# Patient Record
Sex: Female | Born: 1970
Health system: Southern US, Community
[De-identification: ages and names within clinical notes are randomized; demographics above are authoritative.]

## PROBLEM LIST (undated history)

## (undated) DIAGNOSIS — F411 Generalized anxiety disorder: Secondary | ICD-10-CM

## (undated) DIAGNOSIS — I10 Essential (primary) hypertension: Secondary | ICD-10-CM

## (undated) DIAGNOSIS — R0789 Other chest pain: Secondary | ICD-10-CM

## (undated) DIAGNOSIS — R739 Hyperglycemia, unspecified: Secondary | ICD-10-CM

## (undated) DIAGNOSIS — K219 Gastro-esophageal reflux disease without esophagitis: Secondary | ICD-10-CM

## (undated) HISTORY — PX: LAPAROSCOPIC CHOLECYSTECTOMY: SUR755

---

## 2001-03-01 ENCOUNTER — Encounter: Payer: Self-pay | Admitting: Family Medicine

## 2001-03-01 ENCOUNTER — Encounter: Admission: RE | Admit: 2001-03-01 | Discharge: 2001-03-01 | Payer: Self-pay | Admitting: Family Medicine

## 2005-12-14 ENCOUNTER — Ambulatory Visit (HOSPITAL_COMMUNITY): Admission: RE | Admit: 2005-12-14 | Discharge: 2005-12-15 | Payer: Self-pay | Admitting: General Surgery

## 2010-07-02 ENCOUNTER — Other Ambulatory Visit: Payer: Self-pay | Admitting: Family Medicine

## 2010-07-02 DIAGNOSIS — N6322 Unspecified lump in the left breast, upper inner quadrant: Secondary | ICD-10-CM

## 2010-07-07 ENCOUNTER — Ambulatory Visit
Admission: RE | Admit: 2010-07-07 | Discharge: 2010-07-07 | Disposition: A | Discharge status: Home / Self Care | Payer: PRIVATE HEALTH INSURANCE | Source: Ambulatory Visit | Attending: Family Medicine | Admitting: Family Medicine

## 2010-07-07 DIAGNOSIS — N6322 Unspecified lump in the left breast, upper inner quadrant: Secondary | ICD-10-CM

## 2012-01-06 ENCOUNTER — Emergency Department: Payer: Self-pay | Admitting: Unknown Physician Specialty

## 2012-01-06 LAB — URINALYSIS, COMPLETE
Bacteria: NONE SEEN
Bilirubin,UR: NEGATIVE
Blood: NEGATIVE
Glucose,UR: NEGATIVE mg/dL (ref 0–75)
Ketone: NEGATIVE
Leukocyte Esterase: NEGATIVE
Nitrite: NEGATIVE
Ph: 5 (ref 4.5–8.0)
Protein: NEGATIVE
RBC,UR: NONE SEEN /HPF (ref 0–5)
Specific Gravity: 1.004 (ref 1.003–1.030)
Squamous Epithelial: 2
WBC UR: <1 /HPF (ref 0–5)

## 2012-01-06 LAB — CBC
HCT: 38.1 % (ref 35.0–47.0)
HGB: 12.7 g/dL (ref 12.0–16.0)
MCH: 28.7 pg (ref 26.0–34.0)
MCHC: 33.4 g/dL (ref 32.0–36.0)
MCV: 86 fL (ref 80–100)
Platelet: 274 10*3/uL (ref 150–440)
RBC: 4.43 10*6/uL (ref 3.80–5.20)
RDW: 13.5 % (ref 11.5–14.5)
WBC: 11.6 10*3/uL — ABNORMAL HIGH (ref 3.6–11.0)

## 2012-01-06 LAB — COMPREHENSIVE METABOLIC PANEL
Albumin: 4 g/dL (ref 3.4–5.0)
Alkaline Phosphatase: 78 U/L (ref 50–136)
Anion Gap: 7 (ref 7–16)
BUN: 8 mg/dL (ref 7–18)
Bilirubin,Total: 0.5 mg/dL (ref 0.2–1.0)
Calcium, Total: 9 mg/dL (ref 8.5–10.1)
Chloride: 107 mmol/L (ref 98–107)
Co2: 25 mmol/L (ref 21–32)
Creatinine: 0.66 mg/dL (ref 0.60–1.30)
EGFR (African American): >60
EGFR (Non-African Amer.): >60
Glucose: 91 mg/dL (ref 65–99)
Osmolality: 275 (ref 275–301)
Potassium: 3.5 mmol/L (ref 3.5–5.1)
SGOT(AST): 23 U/L (ref 15–37)
SGPT (ALT): 23 U/L (ref 12–78)
Sodium: 139 mmol/L (ref 136–145)
Total Protein: 8.2 g/dL (ref 6.4–8.2)

## 2012-01-08 ENCOUNTER — Ambulatory Visit: Payer: Self-pay | Admitting: Urology

## 2012-01-08 LAB — URINE CULTURE

## 2014-11-30 ENCOUNTER — Telehealth: Payer: PRIVATE HEALTH INSURANCE | Admitting: Nurse Practitioner

## 2014-11-30 DIAGNOSIS — J0100 Acute maxillary sinusitis, unspecified: Secondary | ICD-10-CM

## 2014-11-30 MED ORDER — AMOXICILLIN-POT CLAVULANATE 875-125 MG PO TABS: 1.0000 | ORAL_TABLET | Freq: Two times a day (BID) | ORAL | Status: DC

## 2014-11-30 NOTE — Progress Notes (Signed)

## 2015-02-13 ENCOUNTER — Emergency Department (HOSPITAL_COMMUNITY): Payer: 59

## 2015-02-13 ENCOUNTER — Inpatient Hospital Stay (HOSPITAL_COMMUNITY)
Admission: EM | Admit: 2015-02-13 | Discharge: 2015-02-14 | DRG: 313 | Disposition: A | Discharge status: Home / Self Care | Payer: 59 | Attending: Internal Medicine | Admitting: Internal Medicine

## 2015-02-13 ENCOUNTER — Emergency Department (HOSPITAL_BASED_OUTPATIENT_CLINIC_OR_DEPARTMENT_OTHER): Payer: 59

## 2015-02-13 ENCOUNTER — Encounter (HOSPITAL_COMMUNITY): Payer: Self-pay | Admitting: *Deleted

## 2015-02-13 DIAGNOSIS — R000 Tachycardia, unspecified: Secondary | ICD-10-CM | POA: Diagnosis present

## 2015-02-13 DIAGNOSIS — Z9114 Patient's other noncompliance with medication regimen: Secondary | ICD-10-CM | POA: Diagnosis present

## 2015-02-13 DIAGNOSIS — R0789 Other chest pain: Secondary | ICD-10-CM | POA: Diagnosis not present

## 2015-02-13 DIAGNOSIS — R739 Hyperglycemia, unspecified: Secondary | ICD-10-CM | POA: Diagnosis present

## 2015-02-13 DIAGNOSIS — Z91040 Latex allergy status: Secondary | ICD-10-CM

## 2015-02-13 DIAGNOSIS — F411 Generalized anxiety disorder: Secondary | ICD-10-CM | POA: Diagnosis present

## 2015-02-13 DIAGNOSIS — I10 Essential (primary) hypertension: Secondary | ICD-10-CM | POA: Diagnosis present

## 2015-02-13 DIAGNOSIS — R0600 Dyspnea, unspecified: Secondary | ICD-10-CM | POA: Diagnosis not present

## 2015-02-13 DIAGNOSIS — K219 Gastro-esophageal reflux disease without esophagitis: Secondary | ICD-10-CM | POA: Diagnosis present

## 2015-02-13 DIAGNOSIS — E876 Hypokalemia: Secondary | ICD-10-CM | POA: Diagnosis not present

## 2015-02-13 DIAGNOSIS — Z6833 Body mass index (BMI) 33.0-33.9, adult: Secondary | ICD-10-CM

## 2015-02-13 DIAGNOSIS — E669 Obesity, unspecified: Secondary | ICD-10-CM | POA: Diagnosis present

## 2015-02-13 DIAGNOSIS — R079 Chest pain, unspecified: Secondary | ICD-10-CM

## 2015-02-13 DIAGNOSIS — Z881 Allergy status to other antibiotic agents status: Secondary | ICD-10-CM

## 2015-02-13 HISTORY — DX: Other chest pain: R07.89

## 2015-02-13 HISTORY — DX: Generalized anxiety disorder: F41.1

## 2015-02-13 HISTORY — DX: Gastro-esophageal reflux disease without esophagitis: K21.9

## 2015-02-13 HISTORY — DX: Essential (primary) hypertension: I10

## 2015-02-13 HISTORY — DX: Hyperglycemia, unspecified: R73.9

## 2015-02-13 LAB — BASIC METABOLIC PANEL
Anion gap: 8 (ref 5–15)
Anion gap: 8 (ref 5–15)
BUN: 10 mg/dL (ref 6–20)
BUN: 8 mg/dL (ref 6–20)
CO2: 24 mmol/L (ref 22–32)
CO2: 24 mmol/L (ref 22–32)
Calcium: 8.8 mg/dL — ABNORMAL LOW (ref 8.9–10.3)
Calcium: 8.5 mg/dL — ABNORMAL LOW (ref 8.9–10.3)
Chloride: 106 mmol/L (ref 101–111)
Chloride: 107 mmol/L (ref 101–111)
Creatinine, Ser: 0.69 mg/dL (ref 0.44–1.00)
Creatinine, Ser: 0.57 mg/dL (ref 0.44–1.00)
GFR calc Af Amer: >60 mL/min (ref >60)
GFR calc Af Amer: >60 mL/min (ref >60)
GFR calc non Af Amer: >60 mL/min (ref >60)
GFR calc non Af Amer: >60 mL/min (ref >60)
Glucose, Bld: 142 mg/dL — ABNORMAL HIGH (ref 65–99)
Glucose, Bld: 107 mg/dL — ABNORMAL HIGH (ref 65–99)
Potassium: 2.6 mmol/L — CL (ref 3.5–5.1)
Potassium: 3.5 mmol/L (ref 3.5–5.1)
Sodium: 138 mmol/L (ref 135–145)
Sodium: 139 mmol/L (ref 135–145)

## 2015-02-13 LAB — CBC WITH DIFFERENTIAL/PLATELET
Basophils Absolute: 0.1 10*3/uL (ref 0.0–0.1)
Basophils Relative: 1 %
Eosinophils Absolute: 0.1 10*3/uL (ref 0.0–0.7)
Eosinophils Relative: 1 %
HCT: 37.5 % (ref 36.0–46.0)
Hemoglobin: 12.4 g/dL (ref 12.0–15.0)
Lymphocytes Relative: 26 %
Lymphs Abs: 2.3 10*3/uL (ref 0.7–4.0)
MCH: 29 pg (ref 26.0–34.0)
MCHC: 33.1 g/dL (ref 30.0–36.0)
MCV: 87.8 fL (ref 78.0–100.0)
Monocytes Absolute: 0.6 10*3/uL (ref 0.1–1.0)
Monocytes Relative: 7 %
Neutro Abs: 5.6 10*3/uL (ref 1.7–7.7)
Neutrophils Relative %: 65 %
Platelets: 271 10*3/uL (ref 150–400)
RBC: 4.27 MIL/uL (ref 3.87–5.11)
RDW: 12.8 % (ref 11.5–15.5)
WBC: 8.6 10*3/uL (ref 4.0–10.5)

## 2015-02-13 LAB — TROPONIN I
Troponin I: <0.03 ng/mL (ref <0.031)
Troponin I: <0.03 ng/mL (ref <0.031)
Troponin I: <0.03 ng/mL (ref <0.031)
Troponin I: <0.03 ng/mL (ref <0.031)

## 2015-02-13 LAB — TSH: TSH: 1.975 u[IU]/mL (ref 0.350–4.500)

## 2015-02-13 LAB — CBC
HCT: 36 % (ref 36.0–46.0)
Hemoglobin: 11.5 g/dL — ABNORMAL LOW (ref 12.0–15.0)
MCH: 28.3 pg (ref 26.0–34.0)
MCHC: 31.9 g/dL (ref 30.0–36.0)
MCV: 88.5 fL (ref 78.0–100.0)
Platelets: 270 10*3/uL (ref 150–400)
RBC: 4.07 MIL/uL (ref 3.87–5.11)
RDW: 12.8 % (ref 11.5–15.5)
WBC: 9.4 10*3/uL (ref 4.0–10.5)

## 2015-02-13 LAB — MAGNESIUM: Magnesium: 2 mg/dL (ref 1.7–2.4)

## 2015-02-13 LAB — D-DIMER, QUANTITATIVE: D-Dimer, Quant: <0.27 ug/mL-FEU (ref 0.00–0.48)

## 2015-02-13 MED ORDER — MORPHINE SULFATE (PF) 2 MG/ML IV SOLN: 2.0000 mg | INTRAVENOUS | Status: DC | PRN

## 2015-02-13 MED ORDER — POTASSIUM CHLORIDE 10 MEQ/100ML IV SOLN
INTRAVENOUS | Status: AC
  Filled 2015-02-13: qty 100

## 2015-02-13 MED ORDER — ONDANSETRON HCL 4 MG/2ML IJ SOLN: 4.0000 mg | Freq: Four times a day (QID) | INTRAMUSCULAR | Status: DC | PRN

## 2015-02-13 MED ORDER — METOPROLOL TARTRATE 25 MG PO TABS
25.0000 mg | ORAL_TABLET | Freq: Two times a day (BID) | ORAL | Status: DC
  Administered 2015-02-13 – 2015-02-14 (×3): 25 mg via ORAL
  Filled 2015-02-13 (×3): qty 1

## 2015-02-13 MED ORDER — ASPIRIN EC 81 MG PO TBEC
81.0000 mg | DELAYED_RELEASE_TABLET | Freq: Every day | ORAL | Status: DC
  Administered 2015-02-13 – 2015-02-14 (×2): 81 mg via ORAL
  Filled 2015-02-13 (×2): qty 1

## 2015-02-13 MED ORDER — MAGNESIUM SULFATE IN D5W 10-5 MG/ML-% IV SOLN
INTRAVENOUS | Status: AC
  Filled 2015-02-13: qty 100

## 2015-02-13 MED ORDER — POTASSIUM CHLORIDE 10 MEQ/100ML IV SOLN
10.0000 meq | Freq: Once | INTRAVENOUS | Status: AC
  Administered 2015-02-13: 10 meq via INTRAVENOUS

## 2015-02-13 MED ORDER — POTASSIUM CHLORIDE CRYS ER 20 MEQ PO TBCR
40.0000 meq | EXTENDED_RELEASE_TABLET | Freq: Once | ORAL | Status: AC
  Administered 2015-02-13: 40 meq via ORAL

## 2015-02-13 MED ORDER — CITALOPRAM HYDROBROMIDE 20 MG PO TABS
20.0000 mg | ORAL_TABLET | Freq: Every day | ORAL | Status: DC
  Administered 2015-02-13 – 2015-02-14 (×2): 20 mg via ORAL
  Filled 2015-02-13 (×4): qty 1

## 2015-02-13 MED ORDER — INFLUENZA VAC SPLIT QUAD 0.5 ML IM SUSY
0.5000 mL | PREFILLED_SYRINGE | INTRAMUSCULAR | Status: AC
  Administered 2015-02-14: 0.5 mL via INTRAMUSCULAR
  Filled 2015-02-13: qty 0.5

## 2015-02-13 MED ORDER — NITROGLYCERIN 2 % TD OINT
1.0000 [in_us] | TOPICAL_OINTMENT | Freq: Once | TRANSDERMAL | Status: AC
  Administered 2015-02-13: 1 [in_us] via TOPICAL
  Filled 2015-02-13: qty 1

## 2015-02-13 MED ORDER — MAGNESIUM SULFATE IN D5W 10-5 MG/ML-% IV SOLN
1.0000 g | Freq: Once | INTRAVENOUS | Status: AC
  Administered 2015-02-13: 1 g via INTRAVENOUS
  Filled 2015-02-13: qty 100

## 2015-02-13 MED ORDER — ACETAMINOPHEN 500 MG PO TABS
1000.0000 mg | ORAL_TABLET | Freq: Once | ORAL | Status: AC
  Administered 2015-02-13: 1000 mg via ORAL
  Filled 2015-02-13: qty 2

## 2015-02-13 MED ORDER — ONDANSETRON HCL 4 MG PO TABS
4.0000 mg | ORAL_TABLET | Freq: Four times a day (QID) | ORAL | Status: DC | PRN
Start: 2015-02-13 — End: 2015-02-14

## 2015-02-13 MED ORDER — POTASSIUM CHLORIDE 10 MEQ/100ML IV SOLN
10.0000 meq | INTRAVENOUS | Status: AC
  Administered 2015-02-13 (×3): 10 meq via INTRAVENOUS
  Filled 2015-02-13 (×3): qty 100

## 2015-02-13 MED ORDER — ASPIRIN 325 MG PO TABS
325.0000 mg | ORAL_TABLET | Freq: Once | ORAL | Status: AC
  Administered 2015-02-13: 325 mg via ORAL
  Filled 2015-02-13: qty 1

## 2015-02-13 MED ORDER — POTASSIUM CHLORIDE CRYS ER 20 MEQ PO TBCR
EXTENDED_RELEASE_TABLET | ORAL | Status: AC
  Filled 2015-02-13: qty 2

## 2015-02-13 MED ORDER — PANTOPRAZOLE SODIUM 40 MG PO TBEC
40.0000 mg | DELAYED_RELEASE_TABLET | Freq: Every day | ORAL | Status: DC
  Administered 2015-02-13 – 2015-02-14 (×2): 40 mg via ORAL
  Filled 2015-02-13 (×2): qty 1

## 2015-02-13 MED ORDER — HEPARIN SODIUM (PORCINE) 5000 UNIT/ML IJ SOLN
5000.0000 [IU] | Freq: Three times a day (TID) | INTRAMUSCULAR | Status: DC
  Administered 2015-02-13 – 2015-02-14 (×4): 5000 [IU] via SUBCUTANEOUS
  Filled 2015-02-13 (×4): qty 1

## 2015-02-13 MED ORDER — ALUM & MAG HYDROXIDE-SIMETH 200-200-20 MG/5ML PO SUSP: 30.0000 mL | Freq: Four times a day (QID) | ORAL | Status: DC | PRN

## 2015-02-13 MED ORDER — SODIUM CHLORIDE 0.9 % IJ SOLN
3.0000 mL | Freq: Two times a day (BID) | INTRAMUSCULAR | Status: DC
  Administered 2015-02-13 (×2): 3 mL via INTRAVENOUS

## 2015-02-13 NOTE — ED Notes (Signed)
Pt sinus tach on monitor,  

## 2015-02-13 NOTE — ED Notes (Signed)
Pt ambulatory to restroom, no complaints noted,

## 2015-02-13 NOTE — ED Notes (Signed)
Pt states that she has been having problems with elevated blood pressure and anxiety this evening while at work,

## 2015-02-13 NOTE — Progress Notes (Signed)
Patient seen and examined   Tachycardic during my exam , fairly atypical presentation, likely anxiety  Patient started on metoprolol,   Potassium repleted . D-dimer negative   Will do ECHO,check lipid panel, A1C,  If workup negative,dc home in am

## 2015-02-13 NOTE — ED Notes (Signed)
Pt c/o elevated blood pressure, left chest pain described as a dull sensation with nausea, sob that started tonight,

## 2015-02-13 NOTE — ED Notes (Signed)
Pt placed in hospital bed for comfort, no distress noted,

## 2015-02-13 NOTE — ED Notes (Signed)
Breakfast tray given to pt 

## 2015-02-13 NOTE — ED Notes (Signed)
Pt still remains pain free, Dr Nedra Hai at bedside,

## 2015-02-13 NOTE — ED Notes (Signed)
CRITICAL VALUE ALERT  Critical value received:  Potassium 2.6  Date of notification:  02/13/2015  Time of notification:  02:27  Critical value read back: yes  Nurse who received alert:  Youlanda Mighty Rn   MD notified (1st page):  Dr Wilkie Aye  Time of first page:  02:27  MD notified (2nd page):  Time of second page:  Responding MD:  Dr Ludwig Lean  Time MD responded:  02:57

## 2015-02-13 NOTE — ED Notes (Signed)
Pt ambulatory to restroom, family remains at bedside,

## 2015-02-13 NOTE — ED Provider Notes (Signed)
CSN: [id number]     Arrival date & time 02/13/15  0128 History   First MD Initiated Contact with Patient 02/13/15 0136     Chief Complaint  Patient presents with  . Chest Pain     (Consider location/radiation/quality/duration/timing/severity/associated sxs/prior Treatment) HPI  This is a 44 year old female with a history of hypertension, noncompliant with medications, who presents with chest pain. Patient reports several hours of persistent and worsening chest pain. She states that the pain is dull and in the center of chest. It is nonradiating. There is not a exertional component. She reports associated nausea. Denies shortness of breath. Denies any recent fevers or cough. Reports that she took her blood pressure at home and at work and it was elevated. She also reports that she has felt very anxious. Denies early family history of heart disease, hyperlipidemia, or diabetes. Denies any recent travel, hospitalization, or history of blood clots. Current pain is 4 out of 10. She has not taken an aspirin today.  Past Medical History  Diagnosis Date  . Hypertension    Past Surgical History  Procedure Laterality Date  . Laparoscopic cholecystectomy     History reviewed. No pertinent family history. Social History  Substance Use Topics  . Smoking status: Never Smoker   . Smokeless tobacco: None  . Alcohol Use: No   OB History    No data available     Review of Systems  Constitutional: Negative for fever.  Respiratory: Positive for chest tightness. Negative for cough and shortness of breath.   Cardiovascular: Positive for chest pain. Negative for leg swelling.  Gastrointestinal: Positive for nausea. Negative for vomiting and abdominal pain.  Genitourinary: Negative for dysuria.  Skin: Negative for wound.  Neurological: Negative for headaches.  All other systems reviewed and are negative.     Allergies  Latex and Lisinopril  Home Medications   Prior to Admission medications    Medication Sig Start Date End Date Taking? Authorizing Andrei Mccook  amoxicillin-clavulanate (AUGMENTIN) 875-125 MG per tablet Take 1 tablet by mouth 2 (two) times daily. 11/30/14   Mary-Margaret Daphine Deutscher, FNP   BP 151/86 mmHg  Pulse 97  Temp(Src) 97.8 F (36.6 C) (Oral)  Resp 21  Ht  (1.702 m)  Wt 211 lb (95.709 kg)  BMI 33.04 kg/m2  SpO2 97%  LMP 02/03/2015 Physical Exam  Constitutional: She is oriented to person, place, and time. She appears well-developed and well-nourished.  Overweight  HENT:  Head: Normocephalic and atraumatic.  Cardiovascular: Normal rate, regular rhythm and normal heart sounds.   No murmur heard. Pulmonary/Chest: Effort normal and breath sounds normal. No respiratory distress. She has no wheezes.  Abdominal: Soft. Bowel sounds are normal. There is no tenderness. There is no rebound and no guarding.  Musculoskeletal: She exhibits no edema.  Neurological: She is alert and oriented to person, place, and time.  Skin: Skin is warm and dry.  Psychiatric: She has a normal mood and affect.  Nursing note and vitals reviewed.   ED Course  Procedures (including critical care time) Labs Review Labs Reviewed  BASIC METABOLIC PANEL - Abnormal; Notable for the following:    Potassium 2.6 (*)    Glucose, Bld 142 (*)    Calcium 8.8 (*)    All other components within normal limits  CBC WITH DIFFERENTIAL/PLATELET  TROPONIN I  D-DIMER, QUANTITATIVE (NOT AT [facility])    Imaging Review No results found. I have personally reviewed and evaluated these images and lab results as part of  my medical decision-making.   EKG Interpretation   Date/Time:  Wednesday February 13 2015 01:40:12 EDT Ventricular Rate:  111 PR Interval:  168 QRS Duration: 93 QT Interval:  345 QTC Calculation: 469 R Axis:   1 Text Interpretation:  Sinus tachycardia Ventricular premature complex  Confirmed by HORTON  MD, COURTNEY ([id number]) on 02/13/2015 1:46:19 AM      MDM   Final diagnoses:   Chest pain, unspecified chest pain type  Essential hypertension  Hypokalemia    Patient presents with chest pain. Ongoing for the last several hours. Nontoxic. Vital signs notable for blood pressure of 196/110. She is noncompliant with blood pressure medications. Risk factors for heart disease include hypertension.  Denies other risk factors and ;however glucose is 142 on BMP and patient is obese. Given blood pressure and chest pain, nitroglycerin paste was applied. Aspirin was given. Patient was also tachycardic. For this reason d-dimer was sent. This is negative. EKG is nonischemic. Lab work including initial troponin and d-dimer is negative. Patient was noted to have a potassium of 2.6. Both oral and IV potassium was ordered in addition to magnesium.  2:53 AM On recheck, patient reports improvement of chest pain after administration of nitroglycerin. Currently reports chest pain is 0 out of 10.  Feel patient would benefit from observation for potassium replacement and blood pressure modification as well as serial enzymes. Improvement in chest pain could be secondary to blood pressure improvement versus ACS.  Patient's heart score is 4 if you include obesity and diabetes given evidence of hyperglycemia on testing.    Shon Baton, MD 02/13/15 [phone]

## 2015-02-13 NOTE — ED Notes (Signed)
Pt and family updated on plan of care, no complaints noted,

## 2015-02-13 NOTE — ED Notes (Signed)
Pt reports that she has not been taking her blood pressure medication

## 2015-02-13 NOTE — H&P (Signed)
[facility] History and Physical  Sharel C Dario ZOX:[id number] DOB: 1970/08/02    PCP:   None.   Chief Complaint: chest pain.   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 She has no SOB,nausea, vomiting or abdominal pain.  She has no black or bloody stool.  She was found to have severe HTN in the ER with BP 167/87.  She was given NTG with improvement of her BP and her chest tightness.  Evaluation included EKG with no acute ST T changes, K of 2.6, and Cr of 0.7.  Her CXR was clear.  Hospitalist was asked to admit her for HTN, atypcial chest pain and hypokalemia.   Rewiew of Systems:  Constitutional: Negative for malaise, fever and chills. No significant weight loss or weight gain Eyes: Negative for eye pain, redness and discharge, diplopia, visual changes, or flashes of light. ENMT: Negative for ear pain, hoarseness, nasal congestion, sinus pressure and sore throat. No headaches; tinnitus, drooling, or problem swallowing. Cardiovascular: Negative for  palpitations, diaphoresis, dyspnea and peripheral edema. ; No orthopnea, PND Respiratory: Negative for cough, hemoptysis, wheezing and stridor. No pleuritic chestpain. Gastrointestinal: Negative for nausea, vomiting, diarrhea, constipation, abdominal pain, melena, blood in stool, hematemesis, jaundice and rectal bleeding.    Genitourinary: Negative for frequency, dysuria, incontinence,flank pain and hematuria; Musculoskeletal: Negative for back pain and neck pain. Negative for swelling and trauma.;  Skin: . Negative for pruritus, rash, abrasions, bruising and skin lesion.; ulcerations Neuro: Negative for headache, lightheadedness and neck stiffness. Negative for weakness, altered level of consciousness , altered mental status, extremity weakness, burning feet,  involuntary movement, seizure and syncope.  Psych: negative for anxiety, depression, insomnia, tearfulness, panic attacks, hallucinations, paranoia, suicidal or homicidal ideation    Past Medical History  Diagnosis Date  . Hypertension     Past Surgical History  Procedure Laterality Date  . Laparoscopic cholecystectomy      Medications:  HOME MEDS: Prior to Admission medications   Medication Sig Start Date End Date Taking? Authorizing Kella Splinter  amoxicillin-clavulanate (AUGMENTIN) 875-125 MG per tablet Take 1 tablet by mouth 2 (two) times daily. 11/30/14   Mary-Margaret Daphine Deutscher, FNP     Allergies:  Allergies  Allergen Reactions  . Latex   . Lisinopril     Social History:   reports that she has never smoked. She does not have any smokeless tobacco history on file. She reports that she does not drink alcohol or use illicit drugs.  Family History: History reviewed. No pertinent family history.   Physical Exam: Filed Vitals:   02/13/15 0200 02/13/15 0230 02/13/15 0300 02/13/15 0330  BP: 172/98 151/86 151/91 167/87  Pulse: 105 97 86 90  Temp:      TempSrc:      Resp: Height:      Weight:      SpO2: 99% 97% 96% 100%   Blood pressure 167/87, pulse 90, temperature 97.8 F (36.6 C), temperature source Oral, resp. rate 16, height  (1.702 m), weight 95.709 kg (211 lb), last menstrual period 02/03/2015, SpO2 100 %.  GEN:  Pleasant  patient lying in the stretcher in no acute distress; cooperative with exam. PSYCH:  alert and oriented x4; AANIYAH STROHMear anxious or depressed; affect is appropriate. HEENT: Mucous membranes pink and anicteric; PERRLA; EOM  intact; no cervical lymphadenopathy nor thyromegaly or carotid bruit; no JVD; There were no stridor. Neck is very supple. Breasts:: Not examined CHEST WALL: No tenderness CHEST: Normal respiration, clear to auscultation bilaterally.  HEART: Regular rate and rhythm.  There are no murmur, rub, or gallops.    BACK: No kyphosis or scoliosis; no CVA tenderness ABDOMEN: soft and non-tender; no masses, no organomegaly, normal abdominal bowel sounds; no pannus; no intertriginous candida. There is no rebound and no distention. Rectal Exam: Not done EXTREMITIES: No bone or joint deformity; age-appropriate arthropathy of the hands and knees; no edema; no ulcerations.  There is no calf tenderness. Genitalia: not examined PULSES: 2+ and symmetric SKIN: Normal hydration no rash or ulceration CNS: Cranial nerves 2-12 grossly intact no focal lateralizing neurologic deficit.  Speech is fluent; uvula elevated with phonation, facial symmetry and tongue midline. DTR are normal bilaterally, cerebella exam is intact, barbinski is negative and strengths are equaled bilaterally.  No sensory loss.   Labs on Admission:  Basic Metabolic Panel:  Recent Labs Lab 02/13/15 0151  NA 138  K 2.6*  CL 106  CO2 24  GLUCOSE 142*  BUN 10  CREATININE 0.69  CALCIUM 8.8*  MG 2.0   Liver Function Tests: CBC:  Recent Labs Lab 02/13/15 0151  WBC 8.6  NEUTROABS 5.6  HGB 12.4  HCT 37.5  MCV 87.8  PLT 271   Cardiac Enzymes:  Recent Labs Lab 02/13/15 0151  TROPONINI <0.03    Radiological Exams on Admission: Dg Chest 2 View  02/13/2015   CLINICAL DATA:  Chest pain  EXAM: CHEST  2 VIEW  COMPARISON:  01/06/2012  FINDINGS: Normal heart size and mediastinal contours. No acute infiltrate or edema. No effusion or pneumothorax. No acute osseous findings. Cholecystectomy changes.  IMPRESSION: Negative chest.   Electronically Signed   By: Marnee Spring M.D.   On: 02/13/2015 03:14    EKG: Independently reviewed.    Assessment/Plan Present on Admission:  . Atypical chest pain . Hypertension . GAD (generalized anxiety disorder) . Hypokalemia  PLAN:  Atypical chest pain.   Will admit and r/out.  Start ASA, betablocker, and give pain meds PRN.  Obtain ECHO. HTN:  Will tx with Lopressor BID.  Can use Norvasc if not  controlled.  She is allergic to ACE I.  GAD:  Discussed, and will start her on Lexapro (Celexa in the hospital). Hypokalemia:  She was given IV Magnesium in the ER.  Will check a level.  Continue with more IV boluses.  Unclear why she has hypokalemia.  Could be from Low K diet. She is not on meds causing Potassium loss, having any diarrhea, nausea or vomiting.   Other plans as per orders.  Code Status: FULL Unk Lightning, MD. [facility] Pager [phone] 7pm to 7am.  02/13/2015, 4:15 AM

## 2015-02-14 ENCOUNTER — Encounter (HOSPITAL_COMMUNITY): Payer: Self-pay | Admitting: Internal Medicine

## 2015-02-14 DIAGNOSIS — R739 Hyperglycemia, unspecified: Secondary | ICD-10-CM | POA: Diagnosis present

## 2015-02-14 DIAGNOSIS — K219 Gastro-esophageal reflux disease without esophagitis: Secondary | ICD-10-CM

## 2015-02-14 LAB — COMPREHENSIVE METABOLIC PANEL
ALT: 16 U/L (ref 14–54)
AST: 18 U/L (ref 15–41)
Albumin: 3.7 g/dL (ref 3.5–5.0)
Alkaline Phosphatase: 64 U/L (ref 38–126)
Anion gap: 4 — ABNORMAL LOW (ref 5–15)
BUN: 7 mg/dL (ref 6–20)
CO2: 24 mmol/L (ref 22–32)
Calcium: 8.3 mg/dL — ABNORMAL LOW (ref 8.9–10.3)
Chloride: 109 mmol/L (ref 101–111)
Creatinine, Ser: 0.62 mg/dL (ref 0.44–1.00)
GFR calc Af Amer: >60 mL/min (ref >60)
GFR calc non Af Amer: >60 mL/min (ref >60)
Glucose, Bld: 101 mg/dL — ABNORMAL HIGH (ref 65–99)
Potassium: 3.8 mmol/L (ref 3.5–5.1)
Sodium: 137 mmol/L (ref 135–145)
Total Bilirubin: 0.8 mg/dL (ref 0.3–1.2)
Total Protein: 6.8 g/dL (ref 6.5–8.1)

## 2015-02-14 LAB — LIPID PANEL
Cholesterol: 153 mg/dL (ref 0–200)
HDL: 39 mg/dL — ABNORMAL LOW (ref >40)
LDL Cholesterol: 83 mg/dL (ref 0–99)
Total CHOL/HDL Ratio: 3.9 RATIO
Triglycerides: 155 mg/dL — ABNORMAL HIGH (ref <150)
VLDL: 31 mg/dL (ref 0–40)

## 2015-02-14 LAB — HEMOGLOBIN A1C
Hgb A1c MFr Bld: 5.8 % — ABNORMAL HIGH (ref 4.8–5.6)
Mean Plasma Glucose: 120 mg/dL

## 2015-02-14 MED ORDER — METOPROLOL TARTRATE 25 MG PO TABS: 25.0000 mg | ORAL_TABLET | Freq: Two times a day (BID) | ORAL | Status: DC

## 2015-02-14 MED ORDER — CITALOPRAM HYDROBROMIDE 20 MG PO TABS: 20.0000 mg | ORAL_TABLET | Freq: Every day | ORAL | Status: AC

## 2015-02-14 MED ORDER — PANTOPRAZOLE SODIUM 40 MG PO TBEC: 40.0000 mg | DELAYED_RELEASE_TABLET | Freq: Every day | ORAL | Status: DC

## 2015-02-14 MED ORDER — RANITIDINE HCL 150 MG PO TABS: 150.0000 mg | ORAL_TABLET | Freq: Every day | ORAL | Status: AC | PRN

## 2015-02-14 MED ORDER — METOPROLOL SUCCINATE ER 25 MG PO TB24: 25.0000 mg | ORAL_TABLET | Freq: Every day | ORAL | Status: DC

## 2015-02-14 NOTE — Discharge Summary (Signed)
Physician Discharge Summary  Andrea Durham:[id number] DOB: [birth year] DOA: 02/13/2015  PCP: No primary care provider on file.  Admit date: 02/13/2015 Discharge date: 02/14/2015  Time spent: 40 minutes  Recommendations for Outpatient Follow-up:  1. Patient in process of establishing PCP with [facility]. 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 She had no SOB,nausea, vomiting or abdominal pain. She had no black or bloody stool. She was found to have severe HTN in the ER with BP 167/87. She was given NTG with improvement of her BP and her chest tightness. Evaluation included EKG with no acute ST T changes, K of 2.6, and Cr of 0.7. Her CXR was clear.   Hospital Course:  . Atypical chest pain: risk factor include HTN. Troponin negative x3. EKG without  evidence of ischemia. 2-D echocardiogram with mild LVH, EF 55-60% no wall motion abnormality.  No events on tele. Lipid panel with triglycerides 155 and HDL 39 otherwise unremarkable. No further chest discomfort. Was provided with asa but this will not be continued at discharge. Will follow up with PCP for evaluation of need for OP stress test.   . Hypertension: not taking anti-hypertensive medication. Metoprolol started. At discharge BP controlled. Instructed patient to monitor BP BID  for 2 weeks and take data to PCP follow up for evaluation. Recommend follow up with PCP 1-2 weeks for evaluation of BP.  Marland Kitchen GAD (generalized anxiety disorder): likely contributing to #1. Reports more anxious than usual. Provided Celexa. Recommended follow up with PCP for evaluation of management of anxiety.   . Hypokalemia: repleted and resolved. Recommend BMET at follow up.  Tachycardia: sinus. Mild.  Resolved with metoprolol.   Hyperglycemia: mild. HgA1c mildly elevated. Discussed heart healthy/carb modified diet, lifestyle changes. Recommend OP follow up.   Procedures:  2-D echo: The cavity size was normal. Wall thickness was increased in a pattern of mild LVH. Systolic function was normal. The estimated ejection fraction was in the range of 55% to 60%. Wall motion was normal; there were no regional wall motion abnormalities. Left ventricular diastolic function parameters were normal.  Consultations:  none  Discharge Exam: Filed Vitals:   02/14/15 0600  BP: 117/74  Pulse: 74  Temp: 97.5 F (36.4 C)  Resp: 18    General: well nourished appears comfortable Cardiovascular: RRR no MGR no LE edema Respiratory: normal effort BS clear bilaterally no wheeze  Discharge Instructions   Discharge Instructions    Diet - low sodium heart healthy    Complete by:  As directed      Discharge instructions    Complete by:  As directed   Take medication as directed Monitor BP daily for 2 weeks and take readings to PCP follow up for evaluation of BP control Follow up with PCP 1-2 weeks     Increase activity slowly  Complete by:  As directed           Current Discharge Medication List    START taking these medications   Details  citalopram (CELEXA) 20 MG tablet Take 1 tablet (20 mg total) by mouth daily. Qty: 30 tablet, Refills: 1    metoprolol succinate (TOPROL XL) 25 MG 24 hr tablet Take 1 tablet (25 mg total) by mouth daily. Qty: 30 tablet, Refills: 1     pantoprazole (PROTONIX) 40 MG tablet Take 1 tablet (40 mg total) by mouth daily at 6 (six) AM. Qty: 30 tablet, Refills: 1      CONTINUE these medications which have CHANGED   Details  ranitidine (ZANTAC) 150 MG tablet Take 1 tablet (150 mg total) by mouth daily as needed for heartburn.      CONTINUE these medications which have NOT CHANGED   Details  b complex vitamins tablet Take 1 tablet by mouth daily.    Cholecalciferol (VITAMIN D PO) Take 1 tablet by mouth daily.    Multiple Vitamin (MULTIVITAMIN WITH MINERALS) TABS tablet Take 1 tablet by mouth daily.       Allergies  Allergen Reactions  . Lisinopril Cough  . Latex Rash   Follow-up Information    Follow up with [facility] In 2 weeks.   Specialty:  Family Medicine   Why:  follow up on symptoms, evaluate BP control. Call office for appointment   Contact information:   [address] Duanne Moron [facility] [address] [phone]        The results of significant diagnostics from this hospitalization (including imaging, microbiology, ancillary and laboratory) are listed below for reference.    Significant Diagnostic Studies: Dg Chest 2 View  02/13/2015   CLINICAL DATA:  Chest pain  EXAM: CHEST  2 VIEW  COMPARISON:  01/06/2012  FINDINGS: Normal heart size and mediastinal contours. No acute infiltrate or edema. No effusion or pneumothorax. No acute osseous findings. Cholecystectomy changes.  IMPRESSION: Negative chest.   Electronically Signed   By: Marnee Spring M.D.   On: 02/13/2015 03:14    Microbiology: No results found for this or any previous visit (from the past 240 hour(s)).   Labs: Basic Metabolic Panel:  Recent Labs Lab 02/13/15 0151 02/13/15 0418 02/14/15 0634  NA 138 139 137  K 2.6* 3.5 3.8  CL 106 107 109  CO2 GLUCOSE 142* 107* 101*  BUN CREATININE 0.69 0.57 0.62  CALCIUM 8.8* 8.5* 8.3*  MG 2.0  --   --    Liver Function Tests:  Recent Labs Lab  02/14/15 0634  AST 18  ALT 16  ALKPHOS 64  BILITOT 0.8  PROT 6.8  ALBUMIN 3.7   No results for input(s): LIPASE, AMYLASE in the last 168 hours. No results for input(s): AMMONIA in the last 168 hours. CBC:  Recent Labs Lab 02/13/15 0151 02/13/15 0418  WBC 8.6 9.4  NEUTROABS 5.6  --   HGB 12.4 11.5*  HCT 37.5 36.0  MCV 87.8 88.5  PLT 271 270   Cardiac Enzymes:  Recent Labs Lab 02/13/15 0151 02/13/15 0418 02/13/15 0920 02/13/15 1548  TROPONINI <0.03 <0.03 <0.03 <0.03   BNP: BNP (last 3 results) No results for input(s): BNP in the last 8760 hours.  ProBNP (last 3 results) No results for input(s): PROBNP in the last 8760 hours.  CBG: No results for input(s): GLUCAP in the last 168 hours.  SignedGwenyth Bender  [facility] 02/14/2015, 10:36 AM

## 2015-02-14 NOTE — Care Management Note (Signed)
Case Management Note  Patient Details  Name: Andrea Durham MRN: [id number] Date of Birth: 1970-10-03  Subjective/Objective:                  Pt admitted from home with CP. Pt lives with her husband and will return home at discharge. Pt is independent with ADL's.  Action/Plan: No CM needs noted.Pt for discharge home today.  Expected Discharge Date:  02/14/15               Expected Discharge Plan:  Home/Self Care  In-House Referral:  NA  Discharge planning Services  CM Consult  Post Acute Care Choice:  NA Choice offered to:  NA  DME Arranged:    DME Agency:     HH Arranged:    HH Agency:     Status of Service:  Completed, signed off  Medicare Important Message Given:    Date Medicare IM Given:    Medicare IM give by:    Date Additional Medicare IM Given:    Additional Medicare Important Message give by:     If discussed at Long Length of Stay Meetings, dates discussed:    Additional Comments:  Cheryl Flash, RN 02/14/2015, 10:12 AM

## 2015-04-25 ENCOUNTER — Ambulatory Visit (INDEPENDENT_AMBULATORY_CARE_PROVIDER_SITE_OTHER): Payer: 59 | Admitting: Family Medicine

## 2015-04-25 ENCOUNTER — Ambulatory Visit (INDEPENDENT_AMBULATORY_CARE_PROVIDER_SITE_OTHER): Payer: 59

## 2015-04-25 VITALS — BP 152/80 | HR 90 | Temp 98.2°F | Resp 16 | Ht 67.0 in | Wt 212.0 lb

## 2015-04-25 DIAGNOSIS — S6722XA Crushing injury of left hand, initial encounter: Secondary | ICD-10-CM

## 2015-04-25 DIAGNOSIS — I10 Essential (primary) hypertension: Secondary | ICD-10-CM | POA: Diagnosis not present

## 2015-04-25 DIAGNOSIS — M79645 Pain in left finger(s): Secondary | ICD-10-CM

## 2015-04-25 NOTE — Patient Instructions (Addendum)
Restart metoprolol, Keep a record of your blood pressures outside of the office and if remaining over 140/90 - return to see your primary provider or here.   There is a possible nondisplaced fracture on your finger, but only on one view. I will have the radiologist also read this.  Keep the splint in place for the next at least one week, and follow-up with me  Tuesday the 13th any time between 8 AM and 2 PM,  Or an appointment that Monday if there is a slot available.. If the pain has resolved, can do gentle range of motion of the finger in the next week or so. Tylenol is safest for pain, or ibuprofen if needed as long as your blood pressure is controlled.    Crush Injury, Fingers or Toes A crush injury to the fingers or toes means the tissues have been damaged by being squeezed (compressed). There will be bleeding into the tissues and swelling. Often, blood will collect under the skin. When this happens, the skin on the finger often dies and may slough off (shed) 1 week to 10 days later. Usually, new skin is growing underneath. If the injury has been too severe and the tissue does not survive, the damaged tissue may begin to turn black over several days.  Wounds which occur because of the crushing may be stitched (sutured) shut. However, crush injuries are more likely to become infected than other injuries.These wounds may not be closed as tightly as other types of cuts to prevent infection. Nails involved are often lost. These usually grow back over several weeks.  DIAGNOSIS X-rays may be taken to see if there is any injury to the bones. TREATMENT Broken bones (fractures) may be treated with splinting, depending on the fracture. Often, no treatment is required for fractures of the last bone in the fingers or toes. HOME CARE INSTRUCTIONS   The crushed part should be raised (elevated) above the heart or center of the chest as much as possible for the first several days or as directed. This helps  with pain and lessens swelling. Less swelling increases the chances that the crushed part will survive.  Put ice on the injured area.  Put ice in a plastic bag.  Place a towel between your skin and the bag.  Leave the ice on for 15-20 minutes, 03-04 times a day for the first 2 days.  Only take over-the-counter or prescription medicines for pain, discomfort, or fever as directed by your caregiver.  Use your injured part only as directed.  Change your bandages (dressings) as directed.  Keep all follow-up appointments as directed by your caregiver. Not keeping your appointment could result in a chronic or permanent injury, pain, and disability. If there is any problem keeping the appointment, you must call to reschedule. SEEK IMMEDIATE MEDICAL CARE IF:   There is redness, swelling, or increasing pain in the wound area.  Pus is coming from the wound.  You have a fever.  You notice a bad smell coming from the wound or dressing.  The edges of the wound do not stay together after the sutures have been removed.  You are unable to move the injured finger or toe. MAKE SURE YOU:   Understand these instructions.  Will watch your condition.  Will get help right away if you are not doing well or get worse.   This information is not intended to replace advice given to you by your health care provider. Make sure you discuss  any questions you have with your health care provider.   Document Released: 05/11/2005 Document Revised: 08/03/2011 Document Reviewed: 09/26/2010 Elsevier Interactive Patient Education [employer]

## 2015-04-25 NOTE — Progress Notes (Signed)
Subjective:    Patient ID: Andrea Durham, female    DOB: Mar 25, 1971, 44 y.o.   MRN: [id number]  HPI Andrea Durham is a 44 y.o. female   PCP: Terie Purser, PA-C. At [facility].   Presents due to an injury to her finger - pointer finger to left hand. Sliding glass door slammed onto finger yesterday afternoon. Small lac on underside that bled initially. 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  Sore to use gloves and typing.  No prior injury to that finger.  Tx: ibuprofen and ice.  Swollen, stiff and sore to move today, numb on tip of finger. R hand dominant. Last Td few months ago.    Elevated blood pressure noted on triage. History of hypertension, admitted to the hospital September 22, and discharged on metoprolol at that time we'll plan for follow-up with primary care provider within 2 weeks. Also with hyperglycemia/prediabetes with an A1c of 5.8 during hospitalization. Followed by [facility], last ov yesterday. Taking metoprolol, diet and exercise. BP at their office 130/90. Hasn't taken metoprolol as out of med past few days. Has refill available at pharmacy to fill today.    Patient Active Problem List   Diagnosis Date Noted  . Hyperglycemia 02/14/2015  . GERD (gastroesophageal reflux disease) 02/14/2015  . Atypical chest pain 02/13/2015  . Essential hypertension 02/13/2015  . GAD (generalized anxiety disorder) 02/13/2015  . Hypokalemia 02/13/2015   Past Medical History  Diagnosis Date  . Hypertension   . Atypical chest pain   . GERD (gastroesophageal reflux disease)   . GAD (generalized anxiety disorder)   . Hyperglycemia     HgA1c 5.8   Past Surgical History  Procedure Laterality Date  . Laparoscopic cholecystectomy     Allergies  Allergen Reactions  . Lisinopril Cough  . Latex Rash   Prior to Admission medications   Medication Sig Start Date End Date Taking? Authorizing Provider  b complex vitamins tablet Take 1 tablet by  mouth daily.   Yes Historical Provider, MD  buPROPion (WELLBUTRIN SR) 150 MG 12 hr tablet Take 150 mg by mouth 2 (two) times daily.   Yes Historical Provider, MD  Cholecalciferol (VITAMIN D PO) Take 1 tablet by mouth daily.   Yes Historical Provider, MD  citalopram (CELEXA) 20 MG tablet Take 1 tablet (20 mg total) by mouth daily. 02/14/15  Yes Lesle Chris Black, NP  metoprolol succinate (TOPROL XL) 25 MG 24 hr tablet Take 1 tablet (25 mg total) by mouth daily. 02/14/15  Yes Gwenyth Bender, NP  Multiple Vitamin (MULTIVITAMIN WITH MINERALS) TABS tablet Take 1 tablet by mouth daily.   Yes Historical Provider, MD  ranitidine (ZANTAC) 150 MG tablet Take 1 tablet (150 mg total) by mouth daily as needed for heartburn. 02/14/15  Yes Lesle Chris Black, NP  pantoprazole (PROTONIX) 40 MG tablet Take 1 tablet (40 mg total) by mouth daily at 6 (six) AM. Patient not taking: Reported on 04/25/2015 02/14/15   Gwenyth Bender, NP   Social History   Social History  . Marital Status: Married    Spouse Name: N/A  . Number of Children: N/A  . Years of Education: N/A   Occupational History  . Not on file.   Social History Main Topics  . Smoking status: Never Smoker   . Smokeless tobacco: Not on file  . Alcohol Use: No  . Drug Use: No  . Sexual Activity: Not on file   Other  Topics Concern  . Not on file   Social History Narrative       Review of Systems  Constitutional: Negative for fatigue and unexpected weight change.  Respiratory: Negative for chest tightness and shortness of breath.   Cardiovascular: Negative for chest pain, palpitations and leg swelling.  Gastrointestinal: Negative for abdominal pain and blood in stool.  Neurological: Negative for dizziness, syncope, light-headedness and headaches.       Objective:   Physical Exam  Constitutional: She is oriented to person, place, and time. She appears well-developed and well-nourished.  HENT:  Head: Normocephalic and atraumatic.  Eyes: Conjunctivae  and EOM are normal. Pupils are equal, round, and reactive to light.  Neck: Carotid bruit is not present.  Cardiovascular: Normal rate, regular rhythm, normal heart sounds and intact distal pulses.   Pulmonary/Chest: Effort normal and breath sounds normal.  Abdominal: Soft. She exhibits no pulsatile midline mass. There is no tenderness.  Musculoskeletal:       Hands: Able to resist flex and ext at PIP, DIP. And the eye distally with cap refill less than 1 second. NVI distally with cap refill less than 1 second. Tiny abrasion vs superficial lac at volar DIP area. No surrounding erythema or active bleeding.     Neurological: She is alert and oriented to person, place, and time.  Skin: Skin is warm and dry.  Psychiatric: She has a normal mood and affect. Her behavior is normal.  Vitals reviewed.  Filed Vitals:   04/25/15 1221 04/25/15 1254  BP: 155/110 152/80  Pulse: 90   Temp: 98.2 F (36.8 C)   TempSrc: Oral   Resp: 16   Height: 5\' 7"  (1.702 m)   Weight: 212 lb (96.163 kg)   SpO2: 98%     [facility] reading (PRIMARY) by  Dr. Neva SeatGreene: left index finger:  Possible nondisplaced oblique fracture through distal aspect of proximal second phalanx,  Seen on AP view only.     Assessment & Plan:   Andrea Durham is a 44 y.o. female Crush injury of hand, left, initial encounter - Plan: DG Finger Index Left Pain in finger of left hand  - crush injury with possible ND proximal phalynx fx.   -dorsal padded splint, tylenol or advil if needed (if BP controlled)  -recheck in 12 days.  If pain improves prior - gentle rom out of splint ok.   -rtc precautions.   Essential hypertension  - Improved on recheck. Restart metoprolol, follow-up with PCP if remains elevated.  Meds ordered this encounter  Medications  . buPROPion (WELLBUTRIN SR) 150 MG 12 hr tablet    Sig: Take 150 mg by mouth 2 (two) times daily.   Patient Instructions  Restart metoprolol, Keep a record of your blood pressures outside  of the office and if remaining over 140/90 - return to see your primary provider or here.   There is a possible nondisplaced fracture on your finger, but only on one view. I will have the radiologist also read this.  Keep the splint in place for the next at least one week, and follow-up with me  Tuesday the 13th any time between 8 AM and 2 PM,  Or an appointment that Monday if there is a slot available.. If the pain has resolved, can do gentle range of motion of the finger in the next week or so. Tylenol is safest for pain, or ibuprofen if needed as long as your blood pressure is controlled.    Crush Injury,  Fingers or Toes A crush injury to the fingers or toes means the tissues have been damaged by being squeezed (compressed). There will be bleeding into the tissues and swelling. Often, blood will collect under the skin. When this happens, the skin on the finger often dies and may slough off (shed) 1 week to 10 days later. Usually, new skin is growing underneath. If the injury has been too severe and the tissue does not survive, the damaged tissue may begin to turn black over several days.  Wounds which occur because of the crushing may be stitched (sutured) shut. However, crush injuries are more likely to become infected than other injuries.These wounds may not be closed as tightly as other types of cuts to prevent infection. Nails involved are often lost. These usually grow back over several weeks.  DIAGNOSIS X-rays may be taken to see if there is any injury to the bones. TREATMENT Broken bones (fractures) may be treated with splinting, depending on the fracture. Often, no treatment is required for fractures of the last bone in the fingers or toes. HOME CARE INSTRUCTIONS   The crushed part should be raised (elevated) above the heart or center of the chest as much as possible for the first several days or as directed. This helps with pain and lessens swelling. Less swelling increases the chances  that the crushed part will survive.  Put ice on the injured area.  Put ice in a plastic bag.  Place a towel between your skin and the bag.  Leave the ice on for 15-20 minutes, 03-04 times a day for the first 2 days.  Only take over-the-counter or prescription medicines for pain, discomfort, or fever as directed by your caregiver.  Use your injured part only as directed.  Change your bandages (dressings) as directed.  Keep all follow-up appointments as directed by your caregiver. Not keeping your appointment could result in a chronic or permanent injury, pain, and disability. If there is any problem keeping the appointment, you must call to reschedule. SEEK IMMEDIATE MEDICAL CARE IF:   There is redness, swelling, or increasing pain in the wound area.  Pus is coming from the wound.  You have a fever.  You notice a bad smell coming from the wound or dressing.  The edges of the wound do not stay together after the sutures have been removed.  You are unable to move the injured finger or toe. MAKE SURE YOU:   Understand these instructions.  Will watch your condition.  Will get help right away if you are not doing well or get worse.   This information is not intended to replace advice given to you by your health care provider. Make sure you discuss any questions you have with your health care provider.   Document Released: 05/11/2005 Document Revised: 08/03/2011 Document Reviewed: 09/26/2010 Elsevier Interactive Patient Education [employer]

## 2015-05-13 ENCOUNTER — Telehealth: Payer: 59 | Admitting: Family

## 2015-05-13 DIAGNOSIS — J019 Acute sinusitis, unspecified: Secondary | ICD-10-CM

## 2015-05-13 MED ORDER — AMOXICILLIN-POT CLAVULANATE 875-125 MG PO TABS: 1.0000 | ORAL_TABLET | Freq: Two times a day (BID) | ORAL | Status: DC

## 2015-05-13 MED ORDER — BENZONATATE 200 MG PO CAPS: 200.0000 mg | ORAL_CAPSULE | Freq: Three times a day (TID) | ORAL | Status: DC | PRN

## 2015-05-13 NOTE — Progress Notes (Signed)
We are sorry that you are not feeling well.  Here is how we plan to help!  Based on what you have shared with me it looks like you have sinusitis.  Sinusitis is inflammation and infection in the sinus cavities of the head.  Based on your presentation I believe you most likely have Acute Bacterial Sinusitis.  This is an infection caused by bacteria and is treated with antibiotics. I have prescribed Augmentin, an antibiotic in the penicillin family, one tablet twice daily with food, for 7 days. You may use an oral decongestant such as Mucinex D or if you have glaucoma or high blood pressure use plain Mucinex. Saline nasal spray help and can safely be used as often as needed for congestion.  If you develop worsening sinus pain, fever or notice severe headache and vision changes, or if symptoms are not better after completion of antibiotic, please schedule an appointment with a health care provider.    In addition you may use A non-prescription cough medication called Robitussin DAC. Take 2 teaspoons every 8 hours or Delsym: take 2 teaspoons every 12 hours., A non-prescription cough medication called Mucinex DM: take 2 tablets every 12 hours. and A prescription cough medication called Tessalon Perles 100mg . You may take 1-2 capsules every 8 hours as needed for your cough.  Sinus infections are not as easily transmitted as other respiratory infection, however we still recommend that you avoid close contact with loved ones, especially the very young and elderly.  Remember to wash your hands thoroughly throughout the day as this is the number one way to prevent the spread of infection!  Home Care:  Only take medications as instructed by your medical team.  Complete the entire course of an antibiotic.  Do not take these medications with alcohol.  A steam or ultrasonic humidifier can help congestion.  You can place a towel over your head and breathe in the steam from hot water coming from a faucet.  Avoid  close contacts especially the very young and the elderly.  Cover your mouth when you cough or sneeze.  Always remember to wash your hands.  Get Help Right Away If:  You develop worsening fever or sinus pain.  You develop a severe head ache or visual changes.  Your symptoms persist after you have completed your treatment plan.  Make sure you  Understand these instructions.  Will watch your condition.  Will get help right away if you are not doing well or get worse.  Your e-visit answers were reviewed by a board certified advanced clinical practitioner to complete your personal care plan.  Depending on the condition, your plan could have included both over the counter or prescription medications.  If there is a problem please reply  once you have received a response from your provider.  Your safety is important to us.  If you have drug allergies check your prescription carefully.    You can use MyChart to ask questions about today's visit, request a non-urgent call back, or ask for a work or school excuse for 24 hours related to this e-Visit. If it has been greater than 24 hours you will need to follow up with your provider, or enter a new e-Visit to address those concerns.  You will get an e-mail in the next two days asking about your experience.  I hope that your e-visit has been valuable and will speed your recovery. Thank you for using e-visits.

## 2015-05-16 ENCOUNTER — Encounter: Payer: Self-pay | Admitting: Neurology

## 2015-05-16 ENCOUNTER — Ambulatory Visit (INDEPENDENT_AMBULATORY_CARE_PROVIDER_SITE_OTHER): Payer: 59 | Admitting: Neurology

## 2015-05-16 VITALS — BP 146/92 | HR 70 | Resp 14 | Ht 67.0 in | Wt 212.0 lb

## 2015-05-16 DIAGNOSIS — G44209 Tension-type headache, unspecified, not intractable: Secondary | ICD-10-CM

## 2015-05-16 DIAGNOSIS — F329 Major depressive disorder, single episode, unspecified: Secondary | ICD-10-CM

## 2015-05-16 DIAGNOSIS — R0683 Snoring: Secondary | ICD-10-CM

## 2015-05-16 DIAGNOSIS — G4726 Circadian rhythm sleep disorder, shift work type: Secondary | ICD-10-CM

## 2015-05-16 DIAGNOSIS — G4730 Sleep apnea, unspecified: Secondary | ICD-10-CM

## 2015-05-16 DIAGNOSIS — G4489 Other headache syndrome: Secondary | ICD-10-CM

## 2015-05-16 DIAGNOSIS — G4700 Insomnia, unspecified: Secondary | ICD-10-CM

## 2015-05-16 DIAGNOSIS — G4710 Hypersomnia, unspecified: Secondary | ICD-10-CM | POA: Diagnosis not present

## 2015-05-16 DIAGNOSIS — R51 Headache: Secondary | ICD-10-CM

## 2015-05-16 DIAGNOSIS — F32A Depression, unspecified: Secondary | ICD-10-CM

## 2015-05-16 MED ORDER — TRAZODONE HCL 50 MG PO TABS: 50.0000 mg | ORAL_TABLET | Freq: Every day | ORAL | Status: DC

## 2015-05-16 MED ORDER — BUPROPION HCL ER (SR) 150 MG PO TB12: ORAL_TABLET | ORAL | Status: DC

## 2015-05-16 MED ORDER — ZOLPIDEM TARTRATE 10 MG PO TABS: 10.0000 mg | ORAL_TABLET | Freq: Every evening | ORAL | Status: DC | PRN

## 2015-05-16 NOTE — Progress Notes (Signed)
SLEEP MEDICINE CLINIC   Provider:  Melvyn Durham, M D  Referring Provider: Avis Epley, PA* Primary Care Physician:  Andrea Durham  Chief Complaint  Patient presents with  . 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  She goes to bed between 0830-0900, falls asleep without difficulty.  Usually wakes between 1230-1pm--not sure why--will stay in bed until about 3pm, hoping to go back to sleep but usually doesn't.  She has tried Doxepin and didn't sleep any better with that.  She is now taking Ambien 10mg , is able to sleep until about 2am with it.  She has not had a sleep study/fim  . Snoring    HPI:  Andrea Durham is a 44 y.o. 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 Andrea Durham .  Chief complaint according to patient : The patient is a night shift worker and enjoys the team she is working with but over the last years has felt that she has difficulty sleeping in daytime. When her night shifts over and she could sleep at nighttime she has difficulty sleeping at nighttime, too. She has tried a variety of sleep aids. She also suffers from depression and anxiety and feels that the night shift work does not contribute to the depression but that the sleep deprivation and depression anxiety may feet one another. She also has been a chronic snorer for the last decade. She begun gaining weight about 10 years ago she has not woken up refreshed in a long time feels daytime fatigue and has mainly difficulty staying asleep rather than falling asleep. These are typical pictures of apnea as well as of depression related sleep disturbance he. She has tried Benadryl it hasn't helped much and also leave her daytime drowsy, the same for doxepin, she has tried melatonin, which created nightmares.  Sleep habits are as follows:  During he work week,  she works form 7PM to 7 AM , 3 nights a week, she returns home at about 8 AM, no caffeine intake in AM for about 10 month.   She can retreat to her bedroom between 9 and 9:30 AM, the bedroom as core, quiet and dark.  Falls asleep but she does not stay asleep. On she sleeps on 3 pillows and uses other pillows to support her sleep position. Her husband is usually at work when she sleeps. He has noted that she snores, but she may breathe infrequently. She usually wakes up after about 3 hours of sleep with difficulties to reinitiate sleep. Sometimes she has the urge to urinate. This is not always the case. She can reinitiate sleep she may sleep for another 2 hours. She is sleeping less than 5 hours in daytime.  She prefers up from sleep position. In her sleep she seems to resume a supine sleep position and sometimes when she wakes up once herself in supine. She is a dry mouth, she has retrognathia, she believes that she has some bruxism as well. She has TMJ click and pain. She sometimes wakes up with headaches, dull generalized headaches.  A sharper more focal headache has on occasion woken her, not  associated  With tearing of facial flushing or nasal congestion.  She never had sleep paralysis , dream intrusion or dream enactment.    Sleep medical history and family sleep history:  Parents  were snorers and had OSA , mostly dad.  Only child , no history of night terrors or sleep walking.   Social history: married, no biological children. Stepchildren in their twenties. No tobacco use ever, no ETOH, caffeine in form of soda at night , latest at 4 AM, she cut that off.   Review of Systems: Out of a complete 14 system review, the patient complains of only the following symptoms, and all other reviewed systems are negative. snoring fatigue.   How likely are you to doze in the following situations: 0 = not likely, 1 = slight chance, 2 = moderate chance, 3 = high chance  Sitting and Reading?3 Watching  Television?2 Sitting inactive in a public place (theater or meeting)?1 Lying down in the afternoon when circumstances permit?2 Sitting and talking to someone?3 Sitting quietly after lunch without alcohol?0 In a car, while stopped for a few minutes in traffic?1 As a passenger in a car for an hour without a break?0  Total = 12 / 24   Fatigue severity score 53 , PHQ 9 in record.    Social History   Social History  . Marital Status: Married    Spouse Name: N/A  . Number of Children: N/A  . Years of Education: N/A   Occupational History  . Not on file.   Social History Main Topics  . Smoking status: Never Smoker   . Smokeless tobacco: Not on file  . Alcohol Use: No  . Drug Use: No  . Sexual Activity: Not on file   Other Topics Concern  . Not on file   Social History Narrative    No family history on file.  Past Medical History  Diagnosis Date  . Hypertension   . Atypical chest pain   . GERD (gastroesophageal reflux disease)   . GAD (generalized anxiety disorder)   . Hyperglycemia     HgA1c 5.8    Past Surgical History  Procedure Laterality Date  . Laparoscopic cholecystectomy      Current Outpatient Prescriptions  Medication Sig Dispense Refill  . amoxicillin-clavulanate (AUGMENTIN) 875-125 MG tablet Take 1 tablet by mouth 2 (two) times daily. 20 tablet 0  . b complex vitamins tablet Take 1 tablet by mouth daily.    . benzonatate (TESSALON) 200 MG capsule Take 1 capsule (200 mg total) by mouth 3 (three) times daily as needed for cough. 20 capsule 0  . buPROPion (WELLBUTRIN SR) 150 MG 12 hr tablet Take 150 mg by mouth 2 (two) times daily.    . Cholecalciferol (VITAMIN D PO) Take 1 tablet by mouth daily.    . citalopram (CELEXA) 20 MG tablet Take 1 tablet (20 mg total) by mouth daily. 30 tablet 1  . metoprolol succinate (TOPROL XL) 25 MG 24 hr tablet Take 1 tablet (25 mg total) by mouth daily. 30 tablet 1  . Multiple Vitamin (MULTIVITAMIN WITH MINERALS) TABS  tablet Take 1 tablet by mouth daily.    . ranitidine (ZANTAC) 150 MG tablet Take 1 tablet (150 mg total) by mouth daily as needed for heartburn.    . zolpidem (AMBIEN) 5 MG tablet Take 5 mg by mouth at bedtime as needed for sleep.     No current facility-administered medications for this visit.    Allergies as of 05/16/2015 - Review Complete 05/16/2015  Allergen Reaction Noted  . Lisinopril Cough 02/13/2015  . Latex Rash 02/13/2015    Vitals: BP 146/92 mmHg  Pulse 70  Resp 14  Ht 5'  7" (1.702 m)  Wt 212 lb (96.163 kg)  BMI 33.20 kg/m2  LMP 05/01/2015 Last Weight:  Wt Readings from Last 1 Encounters:  05/16/15 212 lb (96.163 kg)   ZOX:WRUE mass index is 33.2 kg/(m^2).     Last Height:   Ht Readings from Last 1 Encounters:  05/16/15  (1.702 m)    Physical exam:  General: The patient is awake, alert and appears not in acute distress. The patient is well groomed. Head: Normocephalic, atraumatic. Neck is supple. Mallampati 4,   neck circumference: 17. Nasal airflow unrestircted  TMJ click and pain is evident . Retrognathia is seen.  Cardiovascular:  Regular rate and rhythm, without  murmurs or carotid bruit, and without distended neck veins. Respiratory: Lungs are clear to auscultation. Skin:  Without evidence of edema, or rash Trunk: BMI is elevated . The patient's posture is hunched  Neurologic exam : The patient is awake and alert, oriented to place and time.   Memory subjective described as intact.  Memory testing revealed   Attention span & concentration ability appears normal.  Speech is fluent,  without  dysarthria, dysphonia or aphasia.  Mood and affect are appropriate.  Cranial nerves: Pupils are equal and briskly reactive to light. Funduscopic exam deferred. Extraocular movements  in vertical and horizontal planes intact and without nystagmus. Visual fields by finger perimetry are intact. Hearing to finger rub intact.   Facial sensation intact to fine  touch.  Facial motor strength is symmetric and tongue and uvula move midline. Shoulder shrug was symmetrical.   Motor exam: Normal tone, muscle bulk and symmetric strength in all extremities.  Sensory:  Fine touch, pinprick and vibration were tested in all extremities. Proprioception tested in the upper extremities was normal.  Coordination: Rapid alternating movements in the fingers/hands was normal. Finger-to-nose maneuver  normal without evidence of ataxia, dysmetria or tremor.  Gait and station: Patient walks without assistive device and is able unassisted to climb up to the exam table. Strength within normal limits.  Stance is stable and normal.  Toe and heel stand were tested .Tandem gait is unfragmented. Turns with 3 Steps. Romberg testing is  Negative. She reports dizziness , feeling a pull  Backwards.   Deep tendon reflexes: in the  upper and lower extremities are symmetric and intact. Babinski maneuver response is  downgoing.  The patient was advised of the nature of the diagnosed sleep disorder , the treatment options and risks for general a health and wellness arising from not treating the condition.  I spent more than 50 minutes of face to face time with the patient. Greater than 50% of time was spent in counseling and coordination of care. We have discussed the diagnosis and differential and I answered the patient's questions.    In reviewing her medication history and the intake of her medication I asked her to take Wellbutrin before she goes to work and after sleep and the same for Celexa. Celexa will help her usually to maintain sleep but shouldn't be taking at bedtime, Wellbutrin is a medication that creates more alertness and should therefore be taken before work and not before bedtime. She is on 1 150 mg tablets of bupropion generic form the usual adult dose would be 2 150 mg tablets. I have found evidence that the generically made 300 mg dose does not work as well as 2 of 150 mg  doses, she should keep the 150 mg dose. Ambien can be taken when she goes to  bed 5 mg dose is low. It seems to work in many cases better in conjunction with melatonin I would recommend only a 3 mg dose of melatonin to be taken an hour before intended bedtime and she could take at the same time the Ambien. She had a negative experience with melatonin so she may just want to take the Ambien at 10 mg and I will be happy to prescribe it for her. Ambien tends to work longer if it's only taking 3 or 4 nights a week and not as a nightly dose. We could alternate between Ambien and trazodone.  She has woken up panicked, diaphoretic and with palpitations, this can be anxiety related, could have been promoted by Melatonin.   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 This has now become and entrainment rhythm. It may be very difficult for her to stop working at night as she has daytime sleep difficulties as well at nighttime sleep difficulties.  2) she has significant risk factors for obstructive sleep apnea #1 weight gain #2 retrognathia with a larger neck,#3 observed snoring and irregular breathing. How much these organic factors play a role in her early morning awakening will be determined in a sleep study.  3) frequent and early spontaneous awakening from sleep rather than inability to initiate sleep are reported. Besides organic sleep disorders as mentioned under #2 these could also be related to the clinical depression. I changed his medication dose in timing and hope that this will support longer and less fragmented sleep. I will also offer 10 mg of Ambien was a  warning that the patient will not operate machinery for at least 7 hours after intake time.  4) sleep related headaches, 2 types,     Plan:  Treatment plan and additional workup : SPLIT night polu ysomnography with Co2 for headaches/ and RV after sleep test.   SPLIT study / capnography in early 2017 . RV   ambien 10 mg    Andrea Novasarmen Elleanna Melling MD   CC PCP,   05/16/2015   CC: Andrea EpleySamantha J Jackson, Pa-c [address] [place], [place] [id number]

## 2015-05-16 NOTE — Patient Instructions (Signed)
Trazodone tablets  What is this medicine?  TRAZODONE (TRAZ oh done) is used to treat depression.  This medicine may be used for other purposes; ask your health care provider or pharmacist if you have questions.  What should I tell my health care provider before I take this medicine?  They need to know if you have any of these conditions:  -attempted suicide or thinking about it  -bipolar disorder  -bleeding problems  -glaucoma  -heart disease, or previous heart attack  -irregular heart beat  -kidney or liver disease  -low levels of sodium in the blood  -an unusual or allergic reaction to trazodone, other medicines, foods, dyes or preservatives  -pregnant or trying to get pregnant  -breast-feeding  How should I use this medicine?  Take this medicine by mouth with a glass of water. Follow the directions on the prescription label. Take this medicine shortly after a meal or a light snack. Take your medicine at regular intervals. Do not take your medicine more often than directed. Do not stop taking this medicine suddenly except upon the advice of your doctor. Stopping this medicine too quickly may cause serious side effects or your condition may worsen.  A special MedGuide will be given to you by the pharmacist with each prescription and refill. Be sure to read this information carefully each time.  Talk to your pediatrician regarding the use of this medicine in children. Special care may be needed.  Overdosage: If you think you have taken too much of this medicine contact a poison control center or emergency room at once.  NOTE: This medicine is only for you. Do not share this medicine with others.  What if I miss a dose?  If you miss a dose, take it as soon as you can. If it is almost time for your next dose, take only that dose. Do not take double or extra doses.  What may interact with this medicine?  Do not take this medicine with any of the following medications:  -certain medicines for fungal infections like  fluconazole, itraconazole, ketoconazole, posaconazole, voriconazole  -cisapride  -dofetilide  -dronedarone  -linezolid  -MAOIs like Carbex, Eldepryl, Marplan, Nardil, and Parnate  -mesoridazine  -methylene blue (injected into a vein)  -pimozide  -saquinavir  -thioridazine  -ziprasidone  This medicine may also interact with the following medications:  -alcohol  -antiviral medicines for HIV or AIDS  -aspirin and aspirin-like medicines  -barbiturates like phenobarbital  -certain medicines for blood pressure, heart disease, irregular heart beat  -certain medicines for depression, anxiety, or psychotic disturbances  -certain medicines for migraine headache like almotriptan, eletriptan, frovatriptan, naratriptan, rizatriptan, sumatriptan, zolmitriptan  -certain medicines for seizures like carbamazepine and phenytoin  -certain medicines for sleep  -certain medicines that treat or prevent blood clots like dalteparin, enoxaparin, warfarin  -digoxin  -fentanyl  -lithium  -NSAIDS, medicines for pain and inflammation, like ibuprofen or naproxen  -other medicines that prolong the QT interval (cause an abnormal heart rhythm)  -rasagiline  -supplements like St. John's wort, kava kava, valerian  -tramadol  -tryptophan  This list may not describe all possible interactions. Give your health care provider a list of all the medicines, herbs, non-prescription drugs, or dietary supplements you use. Also tell them if you smoke, drink alcohol, or use illegal drugs. Some items may interact with your medicine.  What should I watch for while using this medicine?  Tell your doctor if your symptoms do not get better or if they get worse.   Visit your doctor or health care professional for regular checks on your progress. Because it may take several weeks to see the full effects of this medicine, it is important to continue your treatment as prescribed by your doctor.  Patients and their families should watch out for new or worsening thoughts of  suicide or depression. Also watch out for sudden changes in feelings such as feeling anxious, agitated, panicky, irritable, hostile, aggressive, impulsive, severely restless, overly excited and hyperactive, or not being able to sleep. If this happens, especially at the beginning of treatment or after a change in dose, call your health care professional.  You may get drowsy or dizzy. Do not drive, use machinery, or do anything that needs mental alertness until you know how this medicine affects you. Do not stand or sit up quickly, especially if you are an older patient. This reduces the risk of dizzy or fainting spells. Alcohol may interfere with the effect of this medicine. Avoid alcoholic drinks.  This medicine may cause dry eyes and blurred vision. If you wear contact lenses you may feel some discomfort. Lubricating drops may help. See your eye doctor if the problem does not go away or is severe.  Your mouth may get dry. Chewing sugarless gum, sucking hard candy and drinking plenty of water may help. Contact your doctor if the problem does not go away or is severe.  What side effects may I notice from receiving this medicine?  Side effects that you should report to your doctor or health care professional as soon as possible:  -allergic reactions like skin rash, itching or hives, swelling of the face, lips, or tongue  -fast, irregular heartbeat  -feeling faint or lightheaded, falls  -painful erections or other sexual dysfunction  -suicidal thoughts or other mood changes  -trembling  Side effects that usually do not require medical attention (report to your doctor or health care professional if they continue or are bothersome):  -constipation  -headache  -muscle aches or pains  -nausea, vomiting  -unusually weak or tired  This list may not describe all possible side effects. Call your doctor for medical advice about side effects. You may report side effects to FDA at 1-800-FDA-1088.  Where should I keep my  medicine?  Keep out of the reach of children.  Store at room temperature between 15 and 30 degrees C (59 to 86 degrees F). Protect from light. Keep container tightly closed. Throw away any unused medicine after the expiration date.  NOTE: This sheet is a summary. It may not cover all possible information. If you have questions about this medicine, talk to your doctor, pharmacist, or health care provider.     © 2016, Elsevier/Gold Standard. (2012-12-12 15:46:28)

## 2015-06-03 MED FILL — CITALOPRAM HBR 20 MG TABLET: 20 | 30 days supply | Qty: 30 | Fill #1

## 2015-06-03 MED FILL — ZOLPIDEM TARTRATE 5 MG TAB: 5 | 15 days supply | Qty: 30 | Fill #1

## 2015-06-07 DIAGNOSIS — Z6834 Body mass index (BMI) 34.0-34.9, adult: Secondary | ICD-10-CM | POA: Diagnosis not present

## 2015-06-07 DIAGNOSIS — E6609 Other obesity due to excess calories: Secondary | ICD-10-CM | POA: Diagnosis not present

## 2015-06-07 DIAGNOSIS — R21 Rash and other nonspecific skin eruption: Secondary | ICD-10-CM | POA: Diagnosis not present

## 2015-06-07 DIAGNOSIS — Z1389 Encounter for screening for other disorder: Secondary | ICD-10-CM | POA: Diagnosis not present

## 2015-06-07 MED FILL — predniSONE 10 MG (21) TBPK: 10 | 6 days supply | Qty: 21 | Fill #0

## 2015-06-25 ENCOUNTER — Ambulatory Visit (INDEPENDENT_AMBULATORY_CARE_PROVIDER_SITE_OTHER): Payer: 59 | Admitting: Family Medicine

## 2015-06-25 VITALS — BP 175/108 | HR 66 | Temp 98.2°F | Resp 17 | Ht 67.0 in | Wt 213.0 lb

## 2015-06-25 DIAGNOSIS — H532 Diplopia: Secondary | ICD-10-CM | POA: Diagnosis not present

## 2015-06-25 DIAGNOSIS — H5712 Ocular pain, left eye: Secondary | ICD-10-CM

## 2015-06-25 DIAGNOSIS — H31091 Other chorioretinal scars, right eye: Secondary | ICD-10-CM | POA: Diagnosis not present

## 2015-06-25 NOTE — Patient Instructions (Signed)
Please proceed to [facility] right away  [facility] Address: [address] [place], [place], [place] [address] Hours: Open today  8AM-5PM Phone: [phone]

## 2015-06-25 NOTE — Progress Notes (Signed)
[facility] [address], [place] [address] [phone]  Date:  06/25/2015   Name:  Andrea Durham   DOB:  1971-04-14   MRN:  [id number]  PCP:  Pershing Proud    Chief Complaint: Eye Pain   History of Present Illness:  Andrea Durham is a 45 y.o. 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 Around 0400 this am she noted pain in the LEFT eye and some double vision, noted this while she was writing in her charts.   The eye is still painful.  She also continues to have some double vision when she looks up and when she looks to the right The right eye seems to be normal except she has noted some floaters in both eyes  She does wear glasses but no contacts She is not having a headache No other neuro sx noted such as numbness, weakness, etc. She does have some balance issues at baseline and these have not changed.   She has a history of HTN- her BP is normally better than it is today but she is under some stress right now.    BP Readings from Last 3 Encounters:  06/25/15 175/108  05/16/15 146/92  04/25/15 152/80      Patient Active Problem List   Diagnosis Date Noted  . Hyperglycemia 02/14/2015  . GERD (gastroesophageal reflux disease) 02/14/2015  . Atypical chest pain 02/13/2015  . Essential hypertension 02/13/2015  . GAD (generalized anxiety disorder) 02/13/2015  . Hypokalemia 02/13/2015    Past Medical History  Diagnosis Date  . Hypertension   . Atypical chest pain   . GERD (gastroesophageal reflux disease)   . GAD (generalized anxiety disorder)   . Hyperglycemia     HgA1c 5.8    Past Surgical History  Procedure Laterality Date  . Laparoscopic cholecystectomy      Social History  Substance Use Topics  . Smoking status: Never Smoker   . Smokeless tobacco: Not on file  . Alcohol Use: No    No family history on file.  Allergies  Allergen Reactions   . Lisinopril Cough  . Latex Rash    Medication list has been reviewed and updated.  Current Outpatient Prescriptions on File Prior to Visit  Medication Sig Dispense Refill  . b complex vitamins tablet Take 1 tablet by mouth daily.    Marland Kitchen buPROPion (WELLBUTRIN SR) 150 MG 12 hr tablet 2 at end of bed time, before work. 60 tablet 5  . Cholecalciferol (VITAMIN D PO) Take 1 tablet by mouth daily.    . citalopram (CELEXA) 20 MG tablet Take 1 tablet (20 mg total) by mouth daily. 30 tablet 1  . metoprolol succinate (TOPROL XL) 25 MG 24 hr tablet Take 1 tablet (25 mg total) by mouth daily. 30 tablet 1  . Multiple Vitamin (MULTIVITAMIN WITH MINERALS) TABS tablet Take 1 tablet by mouth daily.    . ranitidine (ZANTAC) 150 MG tablet Take 1 tablet (150 mg total) by mouth daily as needed for heartburn.    . traZODone (DESYREL) 50 MG tablet Take 1 tablet (50 mg total) by mouth at bedtime. 30 tablet 3  . zolpidem (AMBIEN) 10 MG tablet Take 1 tablet (10 mg total) by mouth at bedtime as needed for sleep. 30 tablet 1   No current facility-administered medications on file prior to visit.    Review of Systems:  As per HPI- otherwise negative.   Physical Examination: Filed Vitals:   06/25/15 0953 06/25/15 1006  BP: 158/90 175/108  Pulse:    Temp:    Resp:     Filed Vitals:   06/25/15 0949  Height:  (1.702 m)  Weight: 213 lb (96.616 kg)   Body mass index is 33.35 kg/(m^2). Ideal Body Weight: Weight in (lb) to have BMI = 25: 159.3  GEN: WDWN, NAD, Non-toxic, A & O x 3 HEENT: Atraumatic, Normocephalic. Neck supple. No masses, No LAD.  Bilateral TM wnl, oropharynx normal.  PEERL,EOMI.   The globe is not tender to touch but she does have some pain with moving her eye around.   Normal fundoscopic exam Ears and Nose: No external deformity. CV: RRR, No M/G/R. No JVD. No thrill. No extra heart sounds. PULM: CTA B, no wheezes, crackles, rhonchi. No retractions. No resp. distress. No accessory  muscle use. EXTR: No c/c/e NEURO Normal gait.  Normal strength, sensation and DTR of al extremities.  She does wobble a bit with romberg but does not step PSYCH: Normally interactive. Conversant. Not depressed or anxious appearing.  Calm demeanor.    Assessment and Plan: Left eye pain  Double vision  Here today with concerning left eye pain and double vision [facility] kindly agreed to see her today and she will proceed to their office now  Optho- please call me if there is anything we need to do for this pt- if she needs an MRI, etc.   My cell is [phone].  Arthor Captain  [facility]  Address: [address] [place], [place], [place] [address] Hours: Open today  8AM-5PM Phone: [phone]  Signed Abbe Amsterdam, MD

## 2015-07-01 MED FILL — BUPROPION SR 150 MG TABLET: 150 | 30 days supply | Qty: 60 | Fill #1

## 2015-07-01 MED FILL — CITALOPRAM HBR 20 MG TABLET: 20 | 30 days supply | Qty: 30 | Fill #2

## 2015-07-01 MED FILL — ZOLPIDEM TARTRATE 10 MG TAB: 10 | 30 days supply | Qty: 30 | Fill #1

## 2015-07-01 MED FILL — traZODone HCL 50 MG TABS: 50 | 30 days supply | Qty: 30 | Fill #1

## 2015-08-14 MED FILL — traZODone HCL 50 MG TABS: 50 | 30 days supply | Qty: 30 | Fill #2

## 2015-08-14 MED FILL — METOPROLOL SUCC ER 25 MG TA: 25 | 90 days supply | Qty: 90 | Fill #1

## 2015-08-14 MED FILL — BUPROPION HCL SR 150 MG TAB: 150 | 30 days supply | Qty: 60 | Fill #2

## 2015-08-15 MED FILL — CITALOPRAM HBR 20 MG TABLET: 20 | 30 days supply | Qty: 30 | Fill #0

## 2015-09-27 MED FILL — ZOLPIDEM TARTRATE 5 MG TAB: 5 | 15 days supply | Qty: 30 | Fill #0

## 2015-09-27 MED FILL — traZODone HCL 50 MG TABS: 50 | 30 days supply | Qty: 30 | Fill #3

## 2015-10-25 MED FILL — ZOLPIDEM TARTRATE 5 MG TAB: 5 | 15 days supply | Qty: 30 | Fill #1

## 2015-11-27 MED FILL — ZOLPIDEM TARTRATE 5 MG TAB: 5 | 15 days supply | Qty: 30 | Fill #2

## 2015-11-27 MED FILL — BUPROPION SR 150 MG TABLET: 150 | 30 days supply | Qty: 60 | Fill #3

## 2016-04-06 ENCOUNTER — Telehealth: Payer: PRIVATE HEALTH INSURANCE | Admitting: Family

## 2016-04-06 DIAGNOSIS — R6889 Other general symptoms and signs: Secondary | ICD-10-CM

## 2016-04-06 MED ORDER — OSELTAMIVIR PHOSPHATE 75 MG PO CAPS: 75.0000 mg | ORAL_CAPSULE | Freq: Two times a day (BID) | ORAL | 0 refills | Status: DC

## 2016-04-06 MED FILL — OSELTAMIVIR PHOS 75 MG CAP: 75 | 5 days supply | Qty: 10 | Fill #0

## 2016-04-06 NOTE — Progress Notes (Signed)

## 2016-05-05 IMAGING — DX DG CHEST 2V
2 series · 2 of 2 positions shown · non-contrast
Comparison: 01/06/2012

CLINICAL DATA: Chest pain

EXAM:
CHEST  2 VIEW

[chest pa]
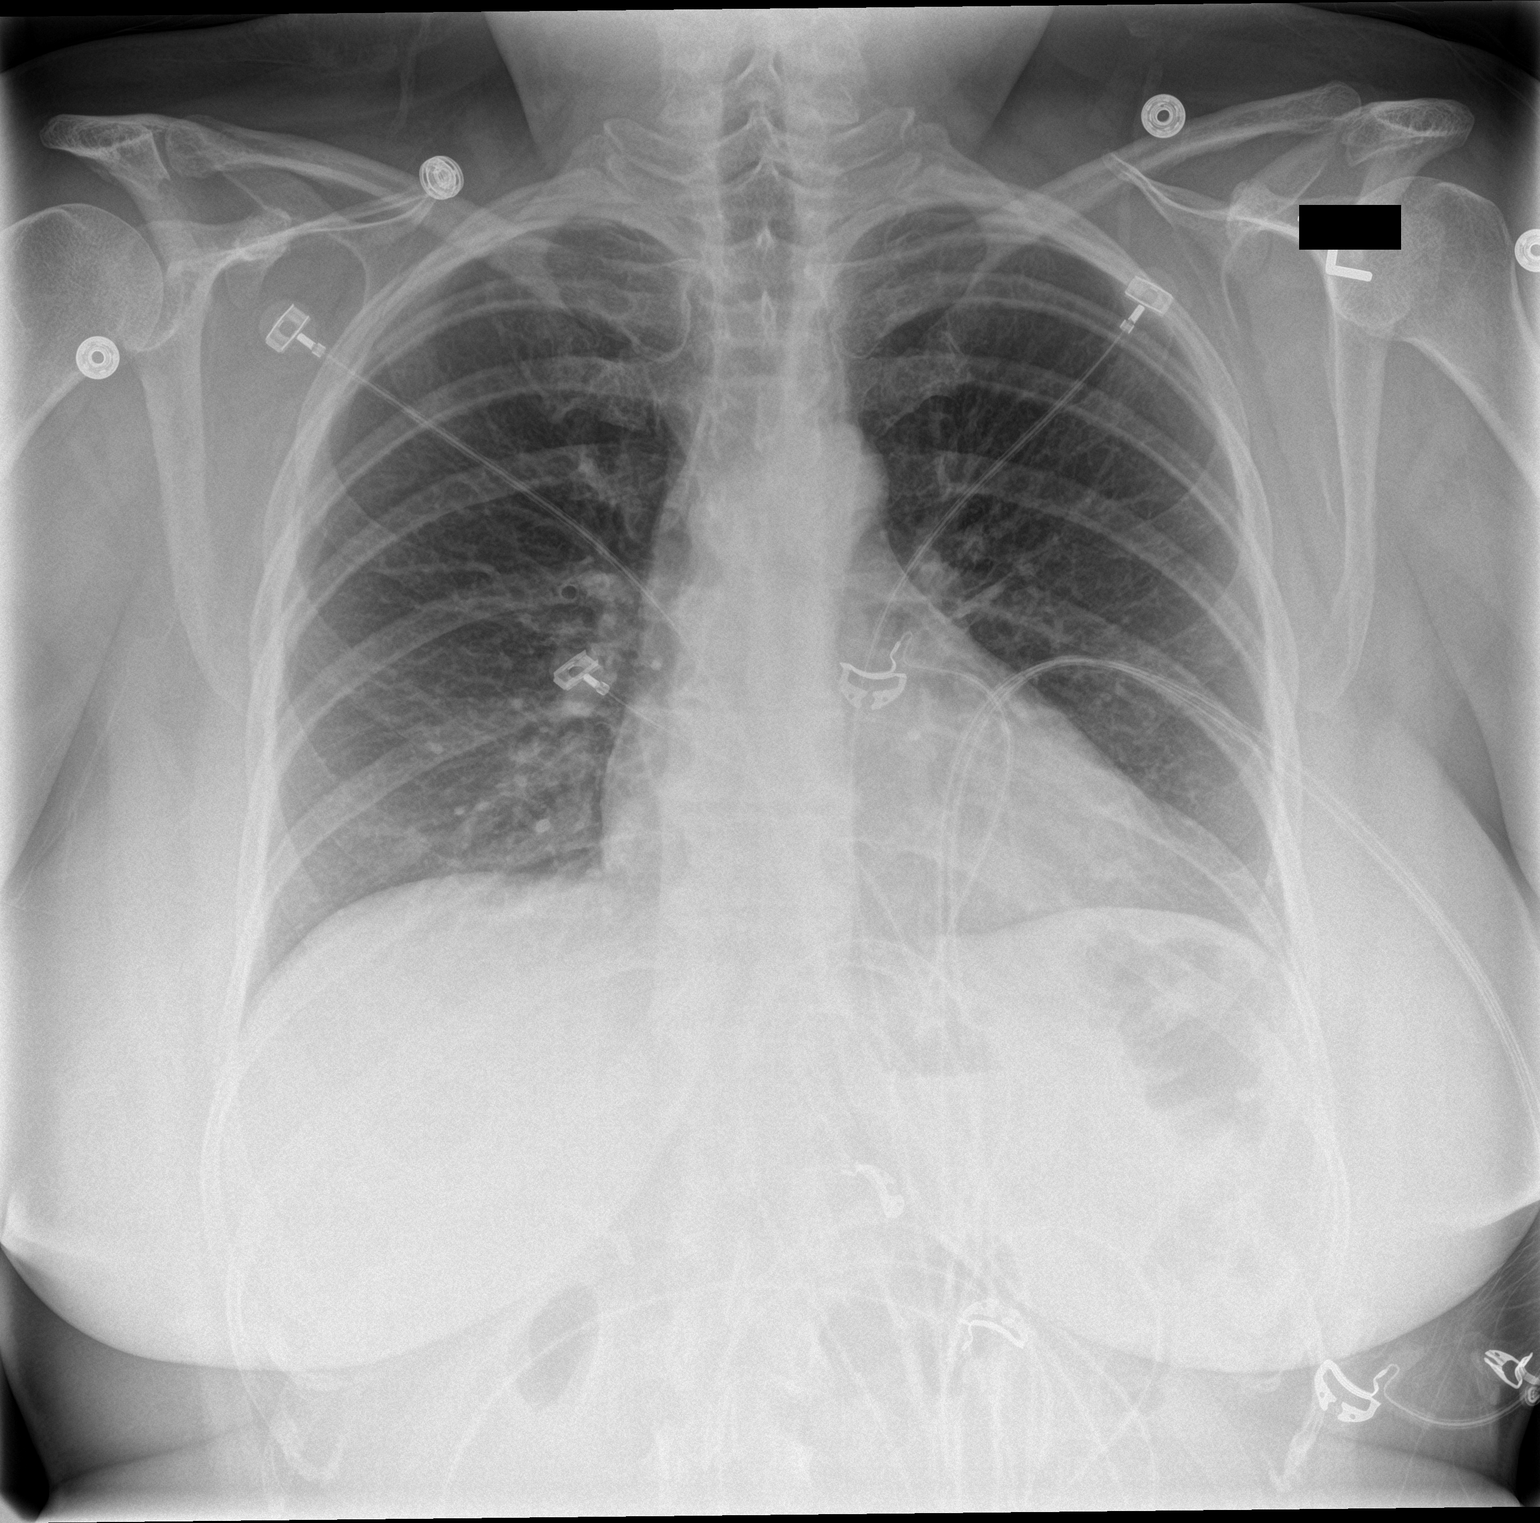

[chest lat]
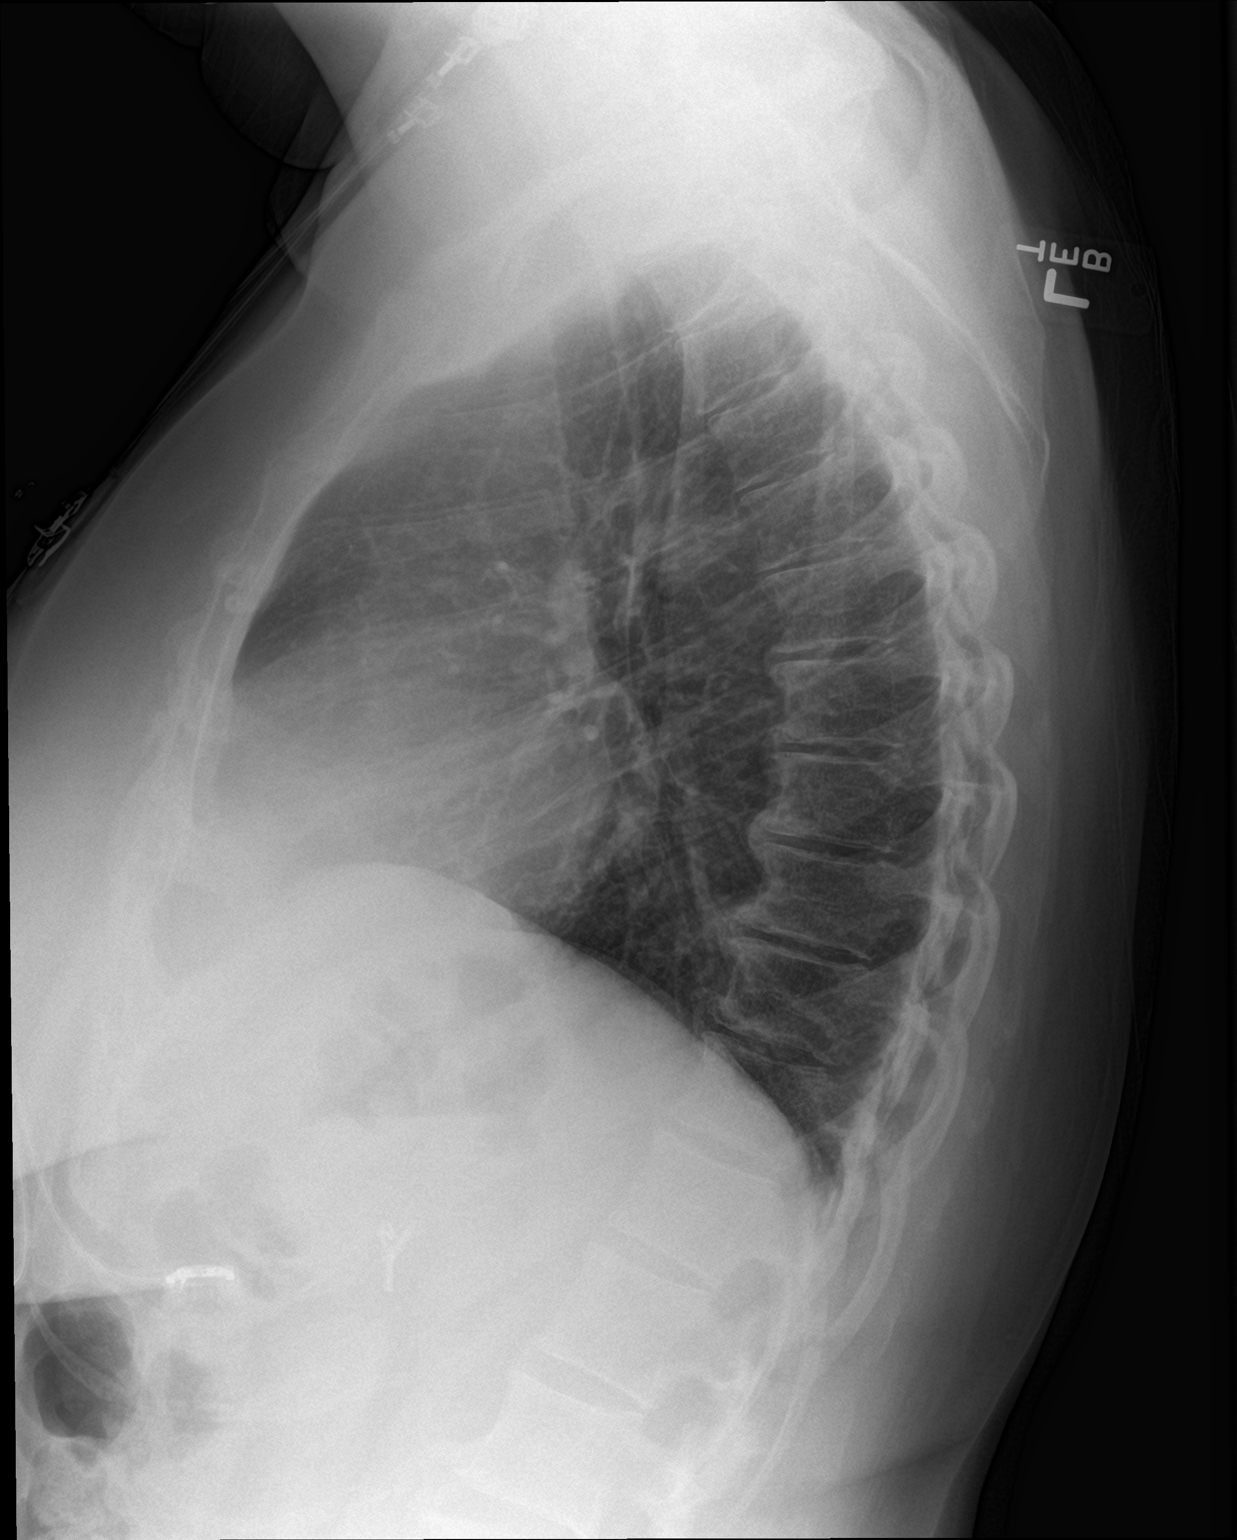

[2 of 2 positions shown; findings below may reference images not displayed]

FINDINGS: Normal heart size and mediastinal contours. No acute infiltrate or
edema. No effusion or pneumothorax. No acute osseous findings.
Cholecystectomy changes.
IMPRESSION: Negative chest.

## 2016-10-12 DIAGNOSIS — H524 Presbyopia: Secondary | ICD-10-CM | POA: Diagnosis not present

## 2016-10-12 DIAGNOSIS — H52223 Regular astigmatism, bilateral: Secondary | ICD-10-CM | POA: Diagnosis not present

## 2016-10-12 DIAGNOSIS — H5213 Myopia, bilateral: Secondary | ICD-10-CM | POA: Diagnosis not present

## 2017-01-13 DIAGNOSIS — J181 Lobar pneumonia, unspecified organism: Secondary | ICD-10-CM | POA: Diagnosis not present

## 2017-01-27 DIAGNOSIS — J181 Lobar pneumonia, unspecified organism: Secondary | ICD-10-CM | POA: Diagnosis not present

## 2017-01-28 DIAGNOSIS — I517 Cardiomegaly: Secondary | ICD-10-CM | POA: Diagnosis not present

## 2017-01-28 DIAGNOSIS — I10 Essential (primary) hypertension: Secondary | ICD-10-CM | POA: Diagnosis not present

## 2017-01-28 DIAGNOSIS — Z6836 Body mass index (BMI) 36.0-36.9, adult: Secondary | ICD-10-CM | POA: Diagnosis not present

## 2017-01-28 DIAGNOSIS — Z1389 Encounter for screening for other disorder: Secondary | ICD-10-CM | POA: Diagnosis not present

## 2017-01-28 MED FILL — AMLODIPINE BESYLATE 5 MG TA: 5 | 30 days supply | Qty: 30 | Fill #0

## 2017-01-28 MED FILL — CITALOPRAM HBR 20 MG TABLET: 20 | 30 days supply | Qty: 30 | Fill #0

## 2017-02-17 ENCOUNTER — Encounter: Payer: Self-pay | Admitting: Cardiovascular Disease

## 2017-03-01 NOTE — Progress Notes (Signed)
Cardiology Office Note   Date:  03/04/2017   ID:  Andrea Durham, DOB [birth year], MRN [id number]  PCP:  Avis Epley, PA-C  Cardiologist:   Charlton Haws, MD   No chief complaint on file.     History of Present Illness: Andrea Durham is a 46 y.o. female who presents for consultation regarding cardiomegaly. Referred by Dr Sherwood Gambler She has chronic HTN and is not always compliant with her meds. Seen by urgent care for elevated BP and CXR Suggested cardiomegaly. Primary resumed her celexa and re prescribed norvasc for her BP She also has depression And obesity  Labs reviewed and unremarkable Hct 35.3 Cr .67 K 4.2 LDL 125 Normal LFTls TSH 1.74   She works at [employer]. Has poor diet with lots of salt has known about HTN for over 3 years but Has not been compliant with meds. She is sedentary. No dyspnea, palpitations or chest pain.  Recently had beta blocker stopped and low dose norvasc added. BP still high     Past Medical History:  Diagnosis Date  . Atypical chest pain   . GAD (generalized anxiety disorder)   . GERD (gastroesophageal reflux disease)   . Hyperglycemia    HgA1c 5.8  . Hypertension     Past Surgical History:  Procedure Laterality Date  . LAPAROSCOPIC CHOLECYSTECTOMY       Current Outpatient Prescriptions  Medication Sig Dispense Refill  . b complex vitamins tablet Take 1 tablet by mouth daily.    . Cholecalciferol (VITAMIN D PO) Take 1 tablet by mouth daily.    . citalopram (CELEXA) 20 MG tablet Take 1 tablet (20 mg total) by mouth daily. 30 tablet 1  . Multiple Vitamin (MULTIVITAMIN WITH MINERALS) TABS tablet Take 1 tablet by mouth daily.    . ranitidine (ZANTAC) 150 MG tablet Take 1 tablet (150 mg total) by mouth daily as needed for heartburn.    Marland Kitchen amLODipine (NORVASC) 10 MG tablet Take 1 tablet (10 mg total) by mouth daily. 180 tablet 3  . losartan (COZAAR) 100 MG tablet Take 1 tablet (100 mg total) by mouth daily. 90 tablet 3   No  current facility-administered medications for this visit.     Allergies:   Lisinopril and Latex    Social History:  The patient  reports that she has never smoked. She has never used smokeless tobacco. She reports that she does not drink alcohol or use drugs.   Family History:  The patient's family history includes Cancer in her father; Heart disease in her father; Hypertension in her mother.    ROS:  Please see the history of present illness.   Otherwise, review of systems are positive for none.   All other systems are reviewed and negative.    PHYSICAL EXAM: VS:  BP (!) 180/90 (BP Location: Left Arm)   Pulse 89   Ht  (1.702 m)   Wt 235 lb (106.6 kg)   SpO2 97%   BMI 36.81 kg/m  , BMI Body mass index is 36.81 kg/m. Affect appropriate Overweight white female  HEENT: normal Neck supple with no adenopathy JVP normal no bruits no thyromegaly Lungs clear with no wheezing and good diaphragmatic motion Heart:  S1/S2 no murmur, no rub, gallop or click PMI normal Abdomen: benighn, BS positve, no tenderness, no AAA no bruit.  No HSM or HJR Distal pulses intact with no bruits No edema Neuro non-focal Skin warm and dry No muscular weakness  EKG:  2016 ST rate 111 otherwise normal    Recent Labs: No results found for requested labs within last 8760 hours.    Lipid Panel    Component Value Date/Time   CHOL 153 02/14/2015 0633   TRIG 155 (H) 02/14/2015 0633   HDL 39 (L) 02/14/2015 0633   CHOLHDL 3.9 02/14/2015 0633   VLDL 31 02/14/2015 0633   LDLCALC 83 02/14/2015 0633      Wt Readings from Last 3 Encounters:  03/04/17 235 lb (106.6 kg)  06/25/15 213 lb (96.6 kg)  05/16/15 212 lb (96.2 kg)      Other studies Reviewed: Additional studies/ records that were reviewed today include: [facility] office note labs.    ASSESSMENT AND PLAN:  1.  Cardiomegaly may be from HTN or just projected from AP CXR will order echo to assess LV size and function  2. HTN  discussed compliance issues and low sodium diet Increase norvasc to 10 mg and add cozaaar 100 mg She will check BP at work and f/u with [facility]  3. Depression seems compensated f/u primary    Current medicines are reviewed at length with the patient today.  The patient does not have concerns regarding medicines.  The following changes have been made:  Increase norvasc 10 mg add cozaar 100 mg   Labs/ tests ordered today include: Echo  Orders Placed This Encounter  Procedures  . ECHOCARDIOGRAM COMPLETE     Disposition:   FU with cardiology PRN      Signed, Charlton Haws, MD  03/04/2017 9:33 AM    [facility] [address] [place], [place], [place]  [address] Phone: [phone]; Fax: [phone]

## 2017-03-03 MED FILL — CITALOPRAM HBR 20 MG TABLET: 20 | 30 days supply | Qty: 30 | Fill #1

## 2017-03-03 MED FILL — AMLODIPINE BESYLATE 5 MG TA: 5 | 30 days supply | Qty: 30 | Fill #1

## 2017-03-04 ENCOUNTER — Encounter: Payer: Self-pay | Admitting: Cardiovascular Disease

## 2017-03-04 ENCOUNTER — Ambulatory Visit (INDEPENDENT_AMBULATORY_CARE_PROVIDER_SITE_OTHER): Payer: 59 | Admitting: Cardiovascular Disease

## 2017-03-04 VITALS — BP 180/90 | HR 89 | Ht 67.0 in | Wt 235.0 lb

## 2017-03-04 DIAGNOSIS — I517 Cardiomegaly: Secondary | ICD-10-CM

## 2017-03-04 MED ORDER — LOSARTAN POTASSIUM 100 MG PO TABS: 100.0000 mg | ORAL_TABLET | Freq: Every day | ORAL | 3 refills | Status: AC

## 2017-03-04 MED ORDER — AMLODIPINE BESYLATE 10 MG PO TABS: 10.0000 mg | ORAL_TABLET | Freq: Every day | ORAL | 3 refills | Status: AC

## 2017-03-04 MED FILL — LOSARTAN POTASSIUM 100 MG T: 100 | 90 days supply | Qty: 90 | Fill #0

## 2017-03-04 NOTE — Patient Instructions (Signed)
Medication Instructions:  Increase norvasc 10 mg daily  Start cozaar 100 mg daily   Labwork: none  Testing/Procedures: Your physician has requested that you have an echocardiogram. Echocardiography is a painless test that uses sound waves to create images of your heart. It provides your doctor with information about the size and shape of your heart and how well your heart's chambers and valves are working. This procedure takes approximately one hour. There are no restrictions for this procedure.    Follow-Up: Your physician recommends that you schedule a follow-up appointment in: as needed   Any Other Special Instructions Will Be Listed Below (If Applicable).     If you need a refill on your cardiac medications before your next appointment, please call your pharmacy.

## 2017-03-15 ENCOUNTER — Ambulatory Visit (HOSPITAL_COMMUNITY)
Admission: RE | Admit: 2017-03-15 | Discharge: 2017-03-15 | Disposition: A | Discharge status: Home / Self Care | Payer: 59 | Source: Ambulatory Visit | Attending: Cardiovascular Disease | Admitting: Cardiovascular Disease

## 2017-03-15 DIAGNOSIS — R0789 Other chest pain: Secondary | ICD-10-CM | POA: Diagnosis not present

## 2017-03-15 DIAGNOSIS — I517 Cardiomegaly: Secondary | ICD-10-CM | POA: Diagnosis not present

## 2017-03-15 DIAGNOSIS — K219 Gastro-esophageal reflux disease without esophagitis: Secondary | ICD-10-CM | POA: Diagnosis not present

## 2017-03-15 DIAGNOSIS — I10 Essential (primary) hypertension: Secondary | ICD-10-CM | POA: Diagnosis not present

## 2017-03-15 DIAGNOSIS — R739 Hyperglycemia, unspecified: Secondary | ICD-10-CM | POA: Insufficient documentation

## 2017-03-15 LAB — ECHOCARDIOGRAM COMPLETE
AO mean calculated velocity dopler: 102 cm/s
AV Area VTI index: 0.75 cm2/m2
AV Area VTI: 1.65 cm2
AV Area mean vel: 1.81 cm2
AV Mean grad: 5 mmHg
AV Peak grad: 11 mmHg
AV VEL mean LVOT/AV: 0.71
AV area mean vel ind: 0.79 cm2/m2
AV peak Index: 0.72
AV pk vel: 169 cm/s
AV vel: 1.72
Ao pk vel: 0.65 m/s
E decel time: 201 msec
E/e' ratio: 9.57
FS: 40 % (ref 28–44)
IVS/LV PW RATIO, ED: 0.88
LA ID, A-P, ES: 36 mm
LA diam end sys: 36 mm
LA diam index: 1.57 cm/m2
LA vol A4C: 59.7 ml
LA vol index: 20.7 mL/m2
LA vol: 47.3 mL
LV E/e' medial: 9.57
LV E/e'average: 9.57
LV PW d: 11.5 mm — AB (ref 0.6–1.1)
LV dias vol index: 29 mL/m2
LV dias vol: 66 mL (ref 46–106)
LV e' LATERAL: 7.29 cm/s
LV sys vol index: 9 mL/m2
LV sys vol: 20 mL (ref 14–42)
LVOT SV: 57 mL
LVOT VTI: 22.6 cm
LVOT area: 2.54 cm2
LVOT diameter: 18 mm
LVOT peak VTI: 0.68 cm
LVOT peak grad rest: 5 mmHg
LVOT peak vel: 110 cm/s
Lateral S' vel: 12.4 cm/s
MV Dec: 201
MV pk A vel: 62.1 m/s
MV pk E vel: 69.8 m/s
RV sys press: 9 mmHg
Reg peak vel: 119 cm/s
Simpson's disk: 70
Stroke v: 46 ml
TAPSE: 17.4 mm
TDI e' lateral: 7.29
TDI e' medial: 7.29
TR max vel: 119 cm/s
VTI: 33.3 cm
Valve area index: 0.75
Valve area: 1.72 cm2

## 2017-03-15 NOTE — Progress Notes (Signed)
*  PRELIMINARY RESULTS* Echocardiogram 2D Echocardiogram has been performed.  Stacey DrainWhite, Brinae Woods J 03/15/2017, 9:12 AM

## 2017-03-16 ENCOUNTER — Telehealth: Payer: Self-pay | Admitting: *Deleted

## 2017-03-16 NOTE — Telephone Encounter (Signed)
-----   Message from Wendall StadePeter C Nishan, MD sent at 03/15/2017  5:48 PM EDT ----- Normal echo with good EF and no significant valvular heart disease No cardiac enlargement

## 2017-03-16 NOTE — Telephone Encounter (Signed)
Called patient with test results. No answer. Left message to call back.  

## 2017-03-29 MED FILL — AMLODIPINE BESYLATE 5 MG TA: 5 | 30 days supply | Qty: 30 | Fill #2

## 2017-03-29 MED FILL — CITALOPRAM HBR 20 MG TABLET: 20 | 30 days supply | Qty: 30 | Fill #2

## 2017-07-06 MED FILL — LOSARTAN POTASSIUM 100 MG T: 100 | 90 days supply | Qty: 90 | Fill #1

## 2017-10-11 MED FILL — LOSARTAN POTASSIUM 100 MG T: 100 | 90 days supply | Qty: 90 | Fill #2

## 2017-10-22 MED FILL — VIT D2 1.25 MG (50,000 UNIT: 1.25 MG | 84 days supply | Qty: 12 | Fill #0

## 2017-10-27 ENCOUNTER — Other Ambulatory Visit (HOSPITAL_COMMUNITY): Payer: Self-pay | Admitting: Family Medicine

## 2017-10-27 DIAGNOSIS — Z1231 Encounter for screening mammogram for malignant neoplasm of breast: Secondary | ICD-10-CM

## 2018-01-19 MED FILL — LOSARTAN POTASSIUM 100 MG T: 100 | 90 days supply | Qty: 90 | Fill #3

## 2019-03-30 DIAGNOSIS — Z6836 Body mass index (BMI) 36.0-36.9, adult: Secondary | ICD-10-CM | POA: Diagnosis not present

## 2019-03-30 DIAGNOSIS — Z23 Encounter for immunization: Secondary | ICD-10-CM | POA: Diagnosis not present

## 2019-03-30 DIAGNOSIS — F4320 Adjustment disorder, unspecified: Secondary | ICD-10-CM | POA: Diagnosis not present

## 2019-03-30 DIAGNOSIS — I10 Essential (primary) hypertension: Secondary | ICD-10-CM | POA: Diagnosis not present

## 2019-03-30 DIAGNOSIS — E6609 Other obesity due to excess calories: Secondary | ICD-10-CM | POA: Diagnosis not present

## 2019-03-30 MED FILL — AMLODIPINE BESYLATE 5 MG TA: 5 | 30 days supply | Qty: 30 | Fill #0

## 2019-03-30 MED FILL — CITALOPRAM HBR 20 MG TABLET: 20 | 30 days supply | Qty: 30 | Fill #0

## 2019-04-25 MED FILL — CITALOPRAM HBR 20 MG TABLET: 20 | 30 days supply | Qty: 30 | Fill #1

## 2019-04-25 MED FILL — AMLODIPINE BESYLATE 5 MG TA: 5 | 30 days supply | Qty: 30 | Fill #1

## 2019-06-13 MED FILL — CITALOPRAM HBR 20 MG TABLET: 20 | 30 days supply | Qty: 30 | Fill #2

## 2019-06-13 MED FILL — AMLODIPINE BESYLATE 5 MG TA: 5 | 30 days supply | Qty: 30 | Fill #2

## 2024-03-03 ENCOUNTER — Other Ambulatory Visit: Payer: Self-pay | Admitting: Medical Genetics
